# Patient Record
Sex: Female | Born: 1955 | Race: White | Hispanic: No | Smoking: Former smoker
Health system: Southern US, Community
[De-identification: ages and names within clinical notes are randomized; demographics above are authoritative.]

## PROBLEM LIST (undated history)

## (undated) DIAGNOSIS — Z87891 Personal history of nicotine dependence: Secondary | ICD-10-CM

## (undated) DIAGNOSIS — L509 Urticaria, unspecified: Secondary | ICD-10-CM

## (undated) DIAGNOSIS — I10 Essential (primary) hypertension: Secondary | ICD-10-CM

## (undated) HISTORY — DX: Urticaria, unspecified: L50.9

## (undated) HISTORY — PX: TOTAL HIP ARTHROPLASTY: SHX124

## (undated) HISTORY — DX: Essential (primary) hypertension: I10

## (undated) HISTORY — DX: Personal history of nicotine dependence: Z87.891

## (undated) HISTORY — PX: CHOLECYSTECTOMY: SHX55

## (undated) HISTORY — PX: VESICOVAGINAL FISTULA CLOSURE W/ TAH: SUR271

---

## 2010-11-03 DIAGNOSIS — F419 Anxiety disorder, unspecified: Secondary | ICD-10-CM | POA: Insufficient documentation

## 2013-03-27 ENCOUNTER — Ambulatory Visit (INDEPENDENT_AMBULATORY_CARE_PROVIDER_SITE_OTHER): Payer: No Typology Code available for payment source | Admitting: Critical Care Medicine

## 2013-03-27 ENCOUNTER — Encounter: Payer: Self-pay | Admitting: Critical Care Medicine

## 2013-03-27 VITALS — BP 142/90 | HR 88 | Temp 98.2°F | Ht 64.0 in | Wt 197.0 lb

## 2013-03-27 DIAGNOSIS — R05 Cough: Secondary | ICD-10-CM | POA: Insufficient documentation

## 2013-03-27 DIAGNOSIS — F17200 Nicotine dependence, unspecified, uncomplicated: Secondary | ICD-10-CM | POA: Insufficient documentation

## 2013-03-27 DIAGNOSIS — I10 Essential (primary) hypertension: Secondary | ICD-10-CM | POA: Insufficient documentation

## 2013-03-27 DIAGNOSIS — J8489 Other specified interstitial pulmonary diseases: Secondary | ICD-10-CM | POA: Insufficient documentation

## 2013-03-27 DIAGNOSIS — J189 Pneumonia, unspecified organism: Secondary | ICD-10-CM

## 2013-03-27 DIAGNOSIS — R918 Other nonspecific abnormal finding of lung field: Secondary | ICD-10-CM

## 2013-03-27 DIAGNOSIS — R059 Cough, unspecified: Secondary | ICD-10-CM | POA: Insufficient documentation

## 2013-03-27 NOTE — Patient Instructions (Signed)
We need to consider a bronchoscopy to evaluate your lungs Stop smoking , use nicorette minis 4mg  or nicoderm CQ patches 21mg  daily (step one) Stay on spiriva daily We will obtain a copy of your CT Scan and your hospital records Return 1 month

## 2013-03-27 NOTE — Progress Notes (Signed)
Subjective:    Patient ID: Amber Scott, female    DOB: 04-29-1955, 58 y.o.   MRN: [id number]  HPI Hx of recurrent PNA, twice. ? Obstruction in lung and hard to improve  First started PNA 01/29/2013. Stayed until 01/31/13. Readmitted 02/18/13 until 02/22/13.  Both times on ABX and STeroids. Also started spiriva. Pt with persisting low grade fever.  CT Scan done abn and now referred.     Now: min cough, and dry, was productive. Was bloody and cleared out, now mucus is white.  Temp now 99, was 104 when ill.  Notes some chest pain, spine hurts up into neck area. No dysphagia.  Occ hoarseness. No heartburn.  occ sinus issues and postnasal drip and has allergies esp in eyes. No ABX in two weeks  Pt has been on steroids and now is off. No wheezing except in the early AM Pt smokes, trying to cut back.  Now smokes 1PK every 5 days, was 1PPD.      Past Medical History  Diagnosis Date  . Hypertension      Family History  Problem Relation Age of Onset  . Emphysema Maternal Uncle     unlce  . Lung cancer Mother   . Lung cancer Maternal Uncle      History   Social History  . Marital Status: Divorced    Spouse Name: N/A    Number of Children: N/A  . Years of Education: N/A   Occupational History  . Retired    Social History Main Topics  . Smoking status: Current Every Day Smoker -- 0.25 packs/day    Types: Cigarettes    Start date: 01/11/1973  . Smokeless tobacco: Never Used  . Alcohol Use: Yes     Comment: wine nightly   . Drug Use: No  . Sexual Activity: Not on file   Other Topics Concern  . Not on file   Social History Narrative  . No narrative on file     No Known Allergies   No outpatient prescriptions prior to visit.   No facility-administered medications prior to visit.     Review of Systems  Constitutional: Positive for fever, chills, diaphoresis, activity change, appetite change and fatigue. Negative for unexpected weight change.  HENT: Positive for ear pain,  postnasal drip, rhinorrhea, sneezing, sore throat, trouble swallowing and voice change. Negative for congestion, dental problem, ear discharge, facial swelling, hearing loss, mouth sores, nosebleeds, sinus pressure and tinnitus.   Eyes: Positive for itching. Negative for photophobia, discharge and visual disturbance.  Respiratory: Positive for cough, chest tightness, shortness of breath and wheezing. Negative for apnea, choking and stridor.   Cardiovascular: Positive for chest pain and palpitations. Negative for leg swelling.  Gastrointestinal: Positive for abdominal pain and abdominal distention. Negative for nausea, vomiting, constipation and blood in stool.  Genitourinary: Positive for flank pain. Negative for dysuria, urgency, frequency, hematuria, decreased urine volume and difficulty urinating.  Musculoskeletal: Positive for back pain, gait problem and neck pain. Negative for arthralgias, joint swelling, myalgias and neck stiffness.  Skin: Positive for rash. Negative for color change and pallor.  Neurological: Positive for dizziness, weakness, light-headedness, numbness and headaches. Negative for tremors, seizures, syncope and speech difficulty.  Hematological: Negative for adenopathy. Does not bruise/bleed easily.  Psychiatric/Behavioral: Positive for confusion, sleep disturbance and agitation. The patient is nervous/anxious.        Objective:   Physical Exam Filed Vitals:   03/27/13 1147  BP: 142/90  Pulse: 88  Temp:  98.2 F (36.8 C)  TempSrc: Oral  Height: 5\' 4"  (1.626 m)  Weight: 89.359 kg (197 lb)  SpO2: 94%    Gen: Pleasant, well-nourished, in no distress,  normal affect  ENT: No lesions,  mouth clear,  oropharynx clear, no postnasal drip  Neck: No JVD, no TMG, no carotid bruits  Lungs: No use of accessory muscles, no dullness to percussion, distant BS  Cardiovascular: RRR, heart sounds normal, no murmur or gallops, no peripheral edema  Abdomen: soft and NT, no HSM,   BS normal  Musculoskeletal: No deformities, no cyanosis or clubbing  Neuro: alert, non focal  Skin: Warm, no lesions or rashes  No results found.  No CT available for review [facility] and [facility] into [facility]      Assessment & Plan:   Abnormal CT scan of lung LUL nodule and recurrent pneumonia Plan Needs FOB Obtain outside CT scan chest for review first  Tobacco use disorder Ongoing tobacco use Stop smoking , use nicorette minis 4mg  or nicoderm CQ patches 21mg  daily (step one) >8910min smoking cessation counseling issued  Cough Recurrent cough and pneumonia Needs FOB  Pneumonia Recurrent LUL PNA Needs FOB   Updated Medication List Outpatient Encounter Prescriptions as of 03/27/2013  Medication Sig  . acetaminophen (TYLENOL) 500 MG tablet Take 500 mg by mouth every 6 (six) hours as needed.  Marland Kitchen. amLODipine (NORVASC) 5 MG tablet Take 5 mg by mouth daily.  Marland Kitchen. aspirin 81 MG tablet Take 81 mg by mouth daily.  . citalopram (CELEXA) 20 MG tablet Take 20 mg by mouth daily.  . diphenhydrAMINE (BENADRYL) 2 % cream Apply topically as needed for itching.  . diphenhydrAMINE (BENADRYL) 25 MG tablet Take by mouth. 1 in the morning and 2 at night  . Docusate Calcium (STOOL SOFTENER PO) Take 2 capsules by mouth daily.  Marland Kitchen. ENSURE (ENSURE) Take 237 mLs by mouth 2 (two) times daily.  . Lactobacillus (ACIDOPHILUS PO) Take 1 capsule by mouth daily.  Marland Kitchen. omeprazole (PRILOSEC) 20 MG capsule Take 20 mg by mouth 2 (two) times daily.  Marland Kitchen. tiotropium (SPIRIVA) 18 MCG inhalation capsule Place 18 mcg into inhaler and inhale daily.

## 2013-03-28 NOTE — Assessment & Plan Note (Signed)
Recurrent cough and pneumonia Needs FOB

## 2013-03-28 NOTE — Assessment & Plan Note (Signed)
LUL nodule and recurrent pneumonia Plan Needs FOB Obtain outside CT scan chest for review first

## 2013-03-28 NOTE — Assessment & Plan Note (Signed)
Recurrent LUL PNA Needs FOB

## 2013-03-28 NOTE — Assessment & Plan Note (Signed)
Ongoing tobacco use Stop smoking , use nicorette minis 4mg  or nicoderm CQ patches 21mg  daily (step one) >2010min smoking cessation counseling issued

## 2013-04-02 ENCOUNTER — Institutional Professional Consult (permissible substitution): Payer: Self-pay | Admitting: Internal Medicine

## 2013-04-04 ENCOUNTER — Telehealth: Payer: Self-pay | Admitting: Critical Care Medicine

## 2013-04-04 NOTE — Telephone Encounter (Signed)
Tell her i will call her Thursday sometime to arrange a bronchoscopy for next week.  I have seen the CT scan

## 2013-04-04 NOTE — Telephone Encounter (Signed)
Pt's instructions from 03/27/13 OV:  Patient Instructions     We need to consider a bronchoscopy to evaluate your lungs  Stop smoking , use nicorette minis 4mg  or nicoderm CQ patches 21mg  daily (step one)  Stay on spiriva daily  We will obtain a copy of your CT Scan and your hospital records  Return 1 month   -------  Dr. Delford FieldWright, pt was called by schedulers to reschedule her April follow up in [place] d/t new schedule changes.  I called her back.  Pt states she is still not feeling well and can't get her energy back.  Has a low grade fever 99-99.4.  She takes tylenol for this.  Has some increased DOE and a little chest pain.   No cough.  Pt reports she is still waiting to hear if she will need to have bronch or not.  The CT disc is on your desk.  Pt has currently been scheduled for a follow up in May in [place].  Pls advise.  Thank you.

## 2013-04-05 ENCOUNTER — Other Ambulatory Visit: Payer: Self-pay | Admitting: Critical Care Medicine

## 2013-04-05 ENCOUNTER — Telehealth: Payer: Self-pay | Admitting: Critical Care Medicine

## 2013-04-05 ENCOUNTER — Institutional Professional Consult (permissible substitution): Payer: Self-pay | Admitting: Pulmonary Disease

## 2013-04-05 NOTE — Telephone Encounter (Signed)
[facility], March [facility] Called, spoke with pt.  Informed her of this.  She verbalized understanding, is aware [facility], and to bring a driver.  Pt also aware to be NPO after midnight.  She verbalized understanding and voiced no further questions or concerns at this time.

## 2013-04-05 NOTE — Telephone Encounter (Signed)
Called, spoke with pt.  Informed her below per Dr. Delford FieldWright.  She verbalized understanding.  Will sign off and route msg to PW as a reminder.

## 2013-04-09 ENCOUNTER — Encounter (HOSPITAL_COMMUNITY): Payer: Self-pay

## 2013-04-10 ENCOUNTER — Encounter (HOSPITAL_COMMUNITY)
Admission: RE | Disposition: A | Discharge status: Home / Self Care | Payer: Self-pay | Source: Ambulatory Visit | Attending: Critical Care Medicine

## 2013-04-10 ENCOUNTER — Encounter (HOSPITAL_COMMUNITY): Payer: Self-pay | Admitting: Critical Care Medicine

## 2013-04-10 ENCOUNTER — Ambulatory Visit (HOSPITAL_COMMUNITY): Payer: No Typology Code available for payment source

## 2013-04-10 ENCOUNTER — Ambulatory Visit (HOSPITAL_COMMUNITY)
Admission: RE | Admit: 2013-04-10 | Discharge: 2013-04-10 | Disposition: A | Discharge status: Home / Self Care | Payer: No Typology Code available for payment source | Source: Ambulatory Visit | Attending: Critical Care Medicine | Admitting: Critical Care Medicine

## 2013-04-10 DIAGNOSIS — R918 Other nonspecific abnormal finding of lung field: Secondary | ICD-10-CM

## 2013-04-10 DIAGNOSIS — R05 Cough: Secondary | ICD-10-CM

## 2013-04-10 DIAGNOSIS — Z8701 Personal history of pneumonia (recurrent): Secondary | ICD-10-CM | POA: Insufficient documentation

## 2013-04-10 DIAGNOSIS — Z7982 Long term (current) use of aspirin: Secondary | ICD-10-CM | POA: Insufficient documentation

## 2013-04-10 DIAGNOSIS — R079 Chest pain, unspecified: Secondary | ICD-10-CM | POA: Insufficient documentation

## 2013-04-10 DIAGNOSIS — Z79899 Other long term (current) drug therapy: Secondary | ICD-10-CM | POA: Insufficient documentation

## 2013-04-10 DIAGNOSIS — R911 Solitary pulmonary nodule: Secondary | ICD-10-CM | POA: Insufficient documentation

## 2013-04-10 DIAGNOSIS — F17200 Nicotine dependence, unspecified, uncomplicated: Secondary | ICD-10-CM | POA: Insufficient documentation

## 2013-04-10 DIAGNOSIS — J189 Pneumonia, unspecified organism: Secondary | ICD-10-CM

## 2013-04-10 DIAGNOSIS — I10 Essential (primary) hypertension: Secondary | ICD-10-CM | POA: Insufficient documentation

## 2013-04-10 DIAGNOSIS — R059 Cough, unspecified: Secondary | ICD-10-CM

## 2013-04-10 DIAGNOSIS — R509 Fever, unspecified: Secondary | ICD-10-CM | POA: Insufficient documentation

## 2013-04-10 HISTORY — PX: VIDEO BRONCHOSCOPY: SHX5072

## 2013-04-10 SURGERY — BRONCHOSCOPY, WITH FLUOROSCOPY
Anesthesia: Moderate Sedation | Laterality: Bilateral

## 2013-04-10 MED ORDER — FENTANYL CITRATE 0.05 MG/ML IJ SOLN
INTRAMUSCULAR | Status: DC | PRN
  Administered 2013-04-10: 50 ug via INTRAVENOUS

## 2013-04-10 MED ORDER — MIDAZOLAM HCL 10 MG/2ML IJ SOLN
INTRAMUSCULAR | Status: DC | PRN
  Administered 2013-04-10: 1 mg via INTRAVENOUS
  Administered 2013-04-10: 3 mg via INTRAVENOUS

## 2013-04-10 MED ORDER — FENTANYL CITRATE 0.05 MG/ML IJ SOLN
INTRAMUSCULAR | Status: AC
  Filled 2013-04-10: qty 4

## 2013-04-10 MED ORDER — PHENYLEPHRINE HCL 0.25 % NA SOLN: 1.0000 | Freq: Four times a day (QID) | NASAL | Status: DC | PRN

## 2013-04-10 MED ORDER — SODIUM CHLORIDE 0.9 % IV SOLN
INTRAVENOUS | Status: DC
  Administered 2013-04-10: 08:00:00 via INTRAVENOUS

## 2013-04-10 MED ORDER — MIDAZOLAM HCL 10 MG/2ML IJ SOLN
INTRAMUSCULAR | Status: AC
  Filled 2013-04-10: qty 4

## 2013-04-10 MED ORDER — BUTAMBEN-TETRACAINE-BENZOCAINE 2-2-14 % EX AERO: 1.0000 | INHALATION_SPRAY | Freq: Once | CUTANEOUS | Status: DC

## 2013-04-10 MED ORDER — LIDOCAINE HCL 2 % EX GEL
CUTANEOUS | Status: DC | PRN
  Administered 2013-04-10: 2

## 2013-04-10 MED ORDER — LIDOCAINE HCL 2 % EX GEL: Freq: Once | CUTANEOUS | Status: DC

## 2013-04-10 MED ORDER — LIDOCAINE HCL 1 % IJ SOLN
INTRAMUSCULAR | Status: DC | PRN
  Administered 2013-04-10: 2 mL

## 2013-04-10 MED ORDER — PHENYLEPHRINE HCL 0.25 % NA SOLN
NASAL | Status: DC | PRN
  Administered 2013-04-10: 2 via NASAL

## 2013-04-10 NOTE — Progress Notes (Signed)
I agree with the above note Amber LevansPatrick Arnaldo Scott

## 2013-04-10 NOTE — Progress Notes (Signed)
Video Bronchoscopy  Good patient tolerance  Intervention transbronchial biopsy  Intervention bronchial washing

## 2013-04-10 NOTE — Op Note (Signed)
Bronchoscopy Procedure Note  Date of Operation: 04/10/2013  Pre-op Diagnosis: LUL infiltrate  Post-op Diagnosis: same  Surgeon: Shan LevansPatrick Wright  Anesthesia: Monitored Local Anesthesia with Sedation: Versed 3mg IV Fentanyl 50 mcg IV  Operation: Flexible fiberoptic bronchoscopy, diagnostic   Findings: no endobronchial lesions.  Specimen: TBBx LUL lingula, Bronchial washing LUL  Estimated Blood Loss: less than 50   Complications: mild desatuation on [place] at beginning of case, improved with use of NRM 60%  Indications and History: The patient is a 58 y.o. female with LUL infiltrate.  The risks, benefits, complications, treatment options and expected outcomes were discussed with the patient.  The possibilities of reaction to medication, pulmonary aspiration, perforation of a viscus, bleeding, failure to diagnose a condition and creating a complication requiring transfusion or operation were discussed with the patient who freely signed the consent.    Description of Procedure: The patient was re-examined in the bronchoscopy suite and the site of surgery properly noted/marked.  The patient was identified as Amber Scott and the procedure verified as Flexible Fiberoptic Bronchoscopy.  A Time Out was held and the above information confirmed.   After the induction of topical nasopharyngeal anesthesia, the patient was positioned  and the bronchoscope was passed through the R  nares. The vocal cords were visualized and  1% buffered lidocaine 5 ml was topically placed onto the cords. The cords were edematous, GERD changes, mild VCD. The scope was then passed into the trachea.  1% buffered lidocaine 5 ml was used topically on the carina.  Careful inspection of the tracheal lumen was accomplished. The scope was sequentially passed into the left main and then left upper and lower bronchi and segmental bronchi.     TBBX/bronchial washing LUL lingula  was done and there was two specimen.   The scope was  then withdrawn and advanced into the right main bronchus and then into the RUL, RML, and RLL bronchi and segmental bronchi.  No specimens taken on R.  Endobronchial findings: Mild tracheobronchitis Trachea: Normal mucosa Carina: Normal mucosa Right main bronchus: Normal mucosa Right upper lobe bronchus: Normal mucosa and Edematous mucosa Right middle lobe bronchus: Edematous mucosa Right lower lobe bronchus: Edematous mucosa Left main bronchus:  Edematous mucosa Left upper lobe bronchus: Edematous mucosa, no endobronchial lesion Left lower lobe bronchus: Edematous mucosa  The Patient was taken to the Endoscopy Recovery area in satisfactory condition.  Attestation: I performed the procedure.  Luisa HartPatrick WrightMD

## 2013-04-10 NOTE — Interval H&P Note (Signed)
Pt seen and examined and no interval changes in pt exam. Pt ready for FOB and conscious sedation.  Luisa HartPatrick WrightMD

## 2013-04-10 NOTE — H&P (View-Only) (Signed)
Subjective:    Patient ID: Amber Scott, female    DOB: 04-29-1955, 58 y.o.   MRN: [id number]  HPI Hx of recurrent PNA, twice. ? Obstruction in lung and hard to improve  First started PNA 01/29/2013. Stayed until 01/31/13. Readmitted 02/18/13 until 02/22/13.  Both times on ABX and STeroids. Also started spiriva. Pt with persisting low grade fever.  CT Scan done abn and now referred.     Now: min cough, and dry, was productive. Was bloody and cleared out, now mucus is white.  Temp now 99, was 104 when ill.  Notes some chest pain, spine hurts up into neck area. No dysphagia.  Occ hoarseness. No heartburn.  occ sinus issues and postnasal drip and has allergies esp in eyes. No ABX in two weeks  Pt has been on steroids and now is off. No wheezing except in the early AM Pt smokes, trying to cut back.  Now smokes 1PK every 5 days, was 1PPD.      Past Medical History  Diagnosis Date  . Hypertension      Family History  Problem Relation Age of Onset  . Emphysema Maternal Uncle     unlce  . Lung cancer Mother   . Lung cancer Maternal Uncle      History   Social History  . Marital Status: Divorced    Spouse Name: N/A    Number of Children: N/A  . Years of Education: N/A   Occupational History  . Retired    Social History Main Topics  . Smoking status: Current Every Day Smoker -- 0.25 packs/day    Types: Cigarettes    Start date: 01/11/1973  . Smokeless tobacco: Never Used  . Alcohol Use: Yes     Comment: wine nightly   . Drug Use: No  . Sexual Activity: Not on file   Other Topics Concern  . Not on file   Social History Narrative  . No narrative on file     No Known Allergies   No outpatient prescriptions prior to visit.   No facility-administered medications prior to visit.     Review of Systems  Constitutional: Positive for fever, chills, diaphoresis, activity change, appetite change and fatigue. Negative for unexpected weight change.  HENT: Positive for ear pain,  postnasal drip, rhinorrhea, sneezing, sore throat, trouble swallowing and voice change. Negative for congestion, dental problem, ear discharge, facial swelling, hearing loss, mouth sores, nosebleeds, sinus pressure and tinnitus.   Eyes: Positive for itching. Negative for photophobia, discharge and visual disturbance.  Respiratory: Positive for cough, chest tightness, shortness of breath and wheezing. Negative for apnea, choking and stridor.   Cardiovascular: Positive for chest pain and palpitations. Negative for leg swelling.  Gastrointestinal: Positive for abdominal pain and abdominal distention. Negative for nausea, vomiting, constipation and blood in stool.  Genitourinary: Positive for flank pain. Negative for dysuria, urgency, frequency, hematuria, decreased urine volume and difficulty urinating.  Musculoskeletal: Positive for back pain, gait problem and neck pain. Negative for arthralgias, joint swelling, myalgias and neck stiffness.  Skin: Positive for rash. Negative for color change and pallor.  Neurological: Positive for dizziness, weakness, light-headedness, numbness and headaches. Negative for tremors, seizures, syncope and speech difficulty.  Hematological: Negative for adenopathy. Does not bruise/bleed easily.  Psychiatric/Behavioral: Positive for confusion, sleep disturbance and agitation. The patient is nervous/anxious.        Objective:   Physical Exam Filed Vitals:   03/27/13 1147  BP: 142/90  Pulse: 88  Temp:  98.2 F (36.8 C)  TempSrc: Oral  Height: 5\' 4"  (1.626 m)  Weight: 89.359 kg (197 lb)  SpO2: 94%    Gen: Pleasant, well-nourished, in no distress,  normal affect  ENT: No lesions,  mouth clear,  oropharynx clear, no postnasal drip  Neck: No JVD, no TMG, no carotid bruits  Lungs: No use of accessory muscles, no dullness to percussion, distant BS  Cardiovascular: RRR, heart sounds normal, no murmur or gallops, no peripheral edema  Abdomen: soft and NT, no HSM,   BS normal  Musculoskeletal: No deformities, no cyanosis or clubbing  Neuro: alert, non focal  Skin: Warm, no lesions or rashes  No results found.  No CT available for review [facility] and [facility] into epic      Assessment & Plan:   Abnormal CT scan of lung LUL nodule and recurrent pneumonia Plan Needs FOB Obtain outside CT scan chest for review first  Tobacco use disorder Ongoing tobacco use Stop smoking , use nicorette minis 4mg  or nicoderm CQ patches 21mg  daily (step one) >8910min smoking cessation counseling issued  Cough Recurrent cough and pneumonia Needs FOB  Pneumonia Recurrent LUL PNA Needs FOB   Updated Medication List Outpatient Encounter Prescriptions as of 03/27/2013  Medication Sig  . acetaminophen (TYLENOL) 500 MG tablet Take 500 mg by mouth every 6 (six) hours as needed.  Marland Kitchen. amLODipine (NORVASC) 5 MG tablet Take 5 mg by mouth daily.  Marland Kitchen. aspirin 81 MG tablet Take 81 mg by mouth daily.  . citalopram (CELEXA) 20 MG tablet Take 20 mg by mouth daily.  . diphenhydrAMINE (BENADRYL) 2 % cream Apply topically as needed for itching.  . diphenhydrAMINE (BENADRYL) 25 MG tablet Take by mouth. 1 in the morning and 2 at night  . Docusate Calcium (STOOL SOFTENER PO) Take 2 capsules by mouth daily.  Marland Kitchen. ENSURE (ENSURE) Take 237 mLs by mouth 2 (two) times daily.  . Lactobacillus (ACIDOPHILUS PO) Take 1 capsule by mouth daily.  Marland Kitchen. omeprazole (PRILOSEC) 20 MG capsule Take 20 mg by mouth 2 (two) times daily.  Marland Kitchen. tiotropium (SPIRIVA) 18 MCG inhalation capsule Place 18 mcg into inhaler and inhale daily.

## 2013-04-10 NOTE — Discharge Instructions (Signed)
Flexible Bronchoscopy, Care After These instructions give you information on caring for yourself after your procedure. Your doctor may also give you more specific instructions. Call your doctor if you have any problems or questions after your procedure. HOME CARE  Do not eat or drink anything for 2 hours after your procedure. If you try to eat or drink before the medicine wears off, food or drink could go into your lungs. You could also burn yourself.  After 2 hours have passed and when you can cough and gag normally, you may eat soft food and drink liquids slowly.  The day after the test, you may eat your normal diet.  You may do your normal activities.  Keep all doctor visits. GET HELP RIGHT AWAY IF:  You get more and more short of breath.  You get lightheaded.  You feel like you are going to pass out (faint).  You have chest pain.  You have new problems that worry you.  You cough up more than a little blood.  You cough up more blood than before. MAKE SURE YOU:  Understand these instructions.  Will watch your condition.  Will get help right away if you are not doing well or get worse. Document Released: 10/25/2008 Document Revised: 10/18/2012 Document Reviewed: 09/01/2012 [facility] Patient Information 2014 [employer] [place].   DO NOT EAT OR DRINK ANYTHING UNTIL 10am TODAY AT 04/10/13

## 2013-04-11 ENCOUNTER — Encounter (HOSPITAL_COMMUNITY): Payer: Self-pay | Admitting: Critical Care Medicine

## 2013-04-12 ENCOUNTER — Telehealth: Payer: Self-pay | Admitting: Critical Care Medicine

## 2013-04-12 NOTE — Telephone Encounter (Signed)
Pt is aware that her results are still pending per the order. Advised her that we will call her once the results are in. She agreed and verbalized understanding.

## 2013-04-13 LAB — CULTURE, RESPIRATORY: SPECIAL REQUESTS: NORMAL

## 2013-04-13 LAB — CULTURE, RESPIRATORY W GRAM STAIN

## 2013-04-16 ENCOUNTER — Other Ambulatory Visit: Payer: Self-pay | Admitting: Critical Care Medicine

## 2013-04-16 ENCOUNTER — Other Ambulatory Visit: Payer: Self-pay | Admitting: *Deleted

## 2013-04-16 MED ORDER — AMOXICILLIN-POT CLAVULANATE 875-125 MG PO TABS: 1.0000 | ORAL_TABLET | Freq: Two times a day (BID) | ORAL | Status: DC

## 2013-04-24 ENCOUNTER — Ambulatory Visit: Payer: No Typology Code available for payment source | Admitting: Critical Care Medicine

## 2013-05-07 LAB — FUNGUS CULTURE W SMEAR
FUNGAL SMEAR: NONE SEEN
SPECIAL REQUESTS: NORMAL

## 2013-05-10 ENCOUNTER — Encounter: Payer: Self-pay | Admitting: Critical Care Medicine

## 2013-05-10 ENCOUNTER — Ambulatory Visit (INDEPENDENT_AMBULATORY_CARE_PROVIDER_SITE_OTHER): Payer: No Typology Code available for payment source | Admitting: Critical Care Medicine

## 2013-05-10 VITALS — BP 136/80 | HR 69 | Temp 97.8°F | Ht 64.0 in | Wt 201.0 lb

## 2013-05-10 DIAGNOSIS — F17200 Nicotine dependence, unspecified, uncomplicated: Secondary | ICD-10-CM

## 2013-05-10 DIAGNOSIS — Z9181 History of falling: Secondary | ICD-10-CM

## 2013-05-10 DIAGNOSIS — J8409 Other alveolar and parieto-alveolar conditions: Secondary | ICD-10-CM

## 2013-05-10 DIAGNOSIS — J8489 Other specified interstitial pulmonary diseases: Secondary | ICD-10-CM

## 2013-05-10 DIAGNOSIS — R5381 Other malaise: Secondary | ICD-10-CM

## 2013-05-10 DIAGNOSIS — Z23 Encounter for immunization: Secondary | ICD-10-CM

## 2013-05-10 MED ORDER — FLUTICASONE PROPIONATE 50 MCG/ACT NA SUSP: 2.0000 | Freq: Every day | NASAL | Status: DC

## 2013-05-10 MED ORDER — TIOTROPIUM BROMIDE MONOHYDRATE 18 MCG IN CAPS: 18.0000 ug | ORAL_CAPSULE | Freq: Every day | RESPIRATORY_TRACT | Status: DC

## 2013-05-10 NOTE — Patient Instructions (Addendum)
A physical therapy evaluation for falls will be made Start flonase two puff daily ea nostril No other medication changes Work on smoking cessation, use nicotine lozenges A pneumovax was given Return 3 months

## 2013-05-10 NOTE — Progress Notes (Signed)
Subjective:    Patient ID: Amber Scott, female    DOB: 01/19/1955, 58 y.o.   MRN: [id number]  HPI  Hx of recurrent PNA, twice. ? Obstruction in lung and hard to improve  First started PNA 01/29/2013. Stayed until 01/31/13. Readmitted 02/18/13 until 02/22/13.  Both times on ABX and STeroids. Also started spiriva. Pt with persisting low grade fever.  CT Scan done abn and now referred.     Now: min cough, and dry, was productive. Was bloody and cleared out, now mucus is white.  Temp now 99, was 104 when ill.  Notes some chest pain, spine hurts up into neck area. No dysphagia.  Occ hoarseness. No heartburn.  occ sinus issues and postnasal drip and has allergies esp in eyes. No ABX in two weeks  Pt has been on steroids and now is off. No wheezing except in the early AM Pt smokes, trying to cut back.  Now smokes 1PK every 5 days, was 1PPD.     05/10/2013 Chief Complaint  Patient presents with  . Follow-up    Pt c/o fatigue, sinus congestion, PND, nonprod cough in a.m.  Believes the cough comes from snoring at night.  Pt interested in prevnar.   Pt had FOB, was neg exc beta hemolytic strep on c/s Pt down to 10 cig every two days.  Using ecigs vapor Notes some sinus congestion.  Notes some phlegm in the AM.   ?prevnar use     Review of Systems  Constitutional: Positive for fever, chills, diaphoresis, activity change, appetite change and fatigue. Negative for unexpected weight change.  HENT: Positive for ear pain, postnasal drip, rhinorrhea, sneezing, sore throat, trouble swallowing and voice change. Negative for congestion, dental problem, ear discharge, facial swelling, hearing loss, mouth sores, nosebleeds, sinus pressure and tinnitus.   Eyes: Positive for itching. Negative for photophobia, discharge and visual disturbance.  Respiratory: Positive for cough, chest tightness, shortness of breath and wheezing. Negative for apnea, choking and stridor.   Cardiovascular: Positive for chest pain  and palpitations. Negative for leg swelling.  Gastrointestinal: Positive for abdominal pain and abdominal distention. Negative for nausea, vomiting, constipation and blood in stool.  Genitourinary: Positive for flank pain. Negative for dysuria, urgency, frequency, hematuria, decreased urine volume and difficulty urinating.  Musculoskeletal: Positive for back pain, gait problem and neck pain. Negative for arthralgias, joint swelling, myalgias and neck stiffness.  Skin: Positive for rash. Negative for color change and pallor.  Neurological: Positive for dizziness, weakness, light-headedness, numbness and headaches. Negative for tremors, seizures, syncope and speech difficulty.  Hematological: Negative for adenopathy. Does not bruise/bleed easily.  Psychiatric/Behavioral: Positive for confusion, sleep disturbance and agitation. The patient is nervous/anxious.        Objective:   Physical Exam  Filed Vitals:   05/10/13 1059  BP: 136/80  Pulse: 69  Temp: 97.8 F (36.6 C)  TempSrc: Oral  Height: 5\' 4"  (1.626 m)  Weight: 201 lb (91.173 kg)  SpO2: 97%    Gen: Pleasant, well-nourished, in no distress,  normal affect  ENT: No lesions,  mouth clear,  oropharynx clear, no postnasal drip  Neck: No JVD, no TMG, no carotid bruits  Lungs: No use of accessory muscles, no dullness to percussion, distant BS  Cardiovascular: RRR, heart sounds normal, no murmur or gallops, no peripheral edema  Abdomen: soft and NT, no HSM,  BS normal  Musculoskeletal: No deformities, no cyanosis or clubbing  Neuro: alert, non focal  Skin: Warm, no lesions  or rashes  No results found.       Assessment & Plan:   Organized pneumonia Left upper lobe organized pneumonia now resolved Recent bronchoscopy was negative for malignancy Streptococcus beta-hemolytic was positive on culture Plan Start flonase two puff daily ea nostril No other medication changes Work on smoking cessation, use nicotine  lozenges A pneumovax was given Return 3 months   Tobacco use disorder Ongoing tobacco use Smoking cessation counseling was given   At high risk for falls Physical therapy consultation will be obtained  Updated Medication List Outpatient Encounter Prescriptions as of 05/10/2013  Medication Sig  . acetaminophen (TYLENOL) 500 MG tablet Take 500 mg by mouth every 6 (six) hours as needed.  Marland Kitchen. amLODipine (NORVASC) 5 MG tablet Take 5 mg by mouth daily.  Marland Kitchen. aspirin 81 MG tablet Take 81 mg by mouth daily.  . citalopram (CELEXA) 20 MG tablet Take 20 mg by mouth daily.  . diphenhydrAMINE (BENADRYL) 2 % cream Apply topically as needed for itching.  . diphenhydrAMINE (BENADRYL) 25 MG tablet Take by mouth. 1 in the morning and 2 at night  . Docusate Calcium (STOOL SOFTENER PO) Take 2 capsules by mouth daily.  Marland Kitchen. ENSURE (ENSURE) Take 237 mLs by mouth 2 (two) times daily.  . Lactobacillus (ACIDOPHILUS PO) Take 1 capsule by mouth daily. probiotic  . omeprazole (PRILOSEC) 20 MG capsule Take 20 mg by mouth 2 (two) times daily.  Marland Kitchen. tiotropium (SPIRIVA) 18 MCG inhalation capsule Place 1 capsule (18 mcg total) into inhaler and inhale daily.  . [DISCONTINUED] tiotropium (SPIRIVA) 18 MCG inhalation capsule Place 18 mcg into inhaler and inhale daily.  . fluticasone (FLONASE) 50 MCG/ACT nasal spray Place 2 sprays into both nostrils daily.  . [DISCONTINUED] amoxicillin-clavulanate (AUGMENTIN) 875-125 MG per tablet Take 1 tablet by mouth 2 (two) times daily.

## 2013-05-11 DIAGNOSIS — Z9181 History of falling: Secondary | ICD-10-CM | POA: Insufficient documentation

## 2013-05-11 NOTE — Assessment & Plan Note (Signed)
Ongoing tobacco use Smoking cessation counseling was given

## 2013-05-11 NOTE — Assessment & Plan Note (Signed)
Left upper lobe organized pneumonia now resolved Recent bronchoscopy was negative for malignancy Streptococcus beta-hemolytic was positive on culture Plan Start flonase two puff daily ea nostril No other medication changes Work on smoking cessation, use nicotine lozenges A pneumovax was given Return 3 months

## 2013-05-16 ENCOUNTER — Telehealth: Payer: Self-pay | Admitting: Critical Care Medicine

## 2013-05-16 DIAGNOSIS — R5381 Other malaise: Secondary | ICD-10-CM

## 2013-05-16 DIAGNOSIS — Z9181 History of falling: Secondary | ICD-10-CM

## 2013-05-16 NOTE — Telephone Encounter (Signed)
During pt's OV with PW on 05/10/13, we ordered PT to eval and tx for frequent falls. Pt has orange card.  [facility]. Spoke with PW.  OK to place order for [facility] referral.   Order has been placed.  Pt is aware.

## 2013-05-17 ENCOUNTER — Ambulatory Visit: Payer: No Typology Code available for payment source | Admitting: Critical Care Medicine

## 2013-05-23 LAB — AFB CULTURE WITH SMEAR (NOT AT ARMC)
ACID FAST SMEAR: NONE SEEN
Special Requests: NORMAL

## 2013-08-13 ENCOUNTER — Telehealth: Payer: Self-pay | Admitting: Critical Care Medicine

## 2013-08-13 NOTE — Telephone Encounter (Signed)
Spoke with patient-- wants to know if she can have the Prevnar-13 vaccine at her 49mo follow-up Had 1st PNA vaccine 04/2013, was told to follow up x 3 months [place] 08/20/13 at 9:15a Nothing further needed.

## 2013-08-20 ENCOUNTER — Encounter: Payer: Self-pay | Admitting: Critical Care Medicine

## 2013-08-20 ENCOUNTER — Ambulatory Visit (INDEPENDENT_AMBULATORY_CARE_PROVIDER_SITE_OTHER): Payer: No Typology Code available for payment source | Admitting: Critical Care Medicine

## 2013-08-20 VITALS — BP 144/86 | HR 61 | Temp 97.0°F | Ht 64.5 in | Wt 206.4 lb

## 2013-08-20 DIAGNOSIS — Z23 Encounter for immunization: Secondary | ICD-10-CM

## 2013-08-20 DIAGNOSIS — J449 Chronic obstructive pulmonary disease, unspecified: Secondary | ICD-10-CM

## 2013-08-20 DIAGNOSIS — L509 Urticaria, unspecified: Secondary | ICD-10-CM

## 2013-08-20 DIAGNOSIS — F17200 Nicotine dependence, unspecified, uncomplicated: Secondary | ICD-10-CM

## 2013-08-20 DIAGNOSIS — J309 Allergic rhinitis, unspecified: Secondary | ICD-10-CM | POA: Insufficient documentation

## 2013-08-20 DIAGNOSIS — R87628 Other abnormal cytological findings on specimens from vagina: Secondary | ICD-10-CM | POA: Insufficient documentation

## 2013-08-20 DIAGNOSIS — R87629 Unspecified abnormal cytological findings in specimens from vagina: Secondary | ICD-10-CM | POA: Insufficient documentation

## 2013-08-20 DIAGNOSIS — J4489 Other specified chronic obstructive pulmonary disease: Secondary | ICD-10-CM | POA: Insufficient documentation

## 2013-08-20 DIAGNOSIS — K219 Gastro-esophageal reflux disease without esophagitis: Secondary | ICD-10-CM | POA: Insufficient documentation

## 2013-08-20 DIAGNOSIS — M5412 Radiculopathy, cervical region: Secondary | ICD-10-CM | POA: Insufficient documentation

## 2013-08-20 DIAGNOSIS — M542 Cervicalgia: Secondary | ICD-10-CM | POA: Insufficient documentation

## 2013-08-20 NOTE — Assessment & Plan Note (Signed)
Recurrent hives of unclear etiology with lack of response to Benadryl antihistamine Plan Referral to allergy will be made

## 2013-08-20 NOTE — Progress Notes (Addendum)
Subjective:    Patient ID: Amber GivensCynthia C Scott, female    DOB: 09/11/1955, 58 y.o.   MRN: [id number]  HPI 08/20/2013 Chief Complaint  Patient presents with  . Follow-up    Occas sharp to dull pain in left chest radiating to back and difficulty swallowing.  No SOB, wheezing, or cough.   Pt still with hives.  Pt still weak.  No cough , no wheezing , no dyspnea. Still with dysphagia. Gets hot spots on skin.   Pt denies any significant sore throat, nasal congestion or excess secretions, fever, chills, sweats, unintended weight loss, pleurtic or exertional chest pain, orthopnea PND, or leg swelling Pt denies any increase in rescue therapy over baseline, denies waking up needing it or having any early am or nocturnal exacerbations of coughing/wheezing/or dyspnea. Pt also denies any obvious fluctuation in symptoms with  weather or environmental change or other alleviating or aggravating factors   Review of Systems Constitutional:   No  weight loss, night sweats,  Fevers, chills, fatigue, lassitude. HEENT:   No headaches,  Difficulty swallowing,  Tooth/dental problems,  Sore throat,                No sneezing, itching, ear ache, nasal congestion, post nasal drip,   CV:  No chest pain,  Orthopnea, PND, swelling in lower extremities, anasarca, dizziness, palpitations  GI  No heartburn, indigestion, abdominal pain, nausea, vomiting, diarrhea, change in bowel habits, loss of appetite  Resp: No shortness of breath with exertion or at rest.  No excess mucus, no productive cough,  No non-productive cough,  No coughing up of blood.  No change in color of mucus.  No wheezing.  No chest wall deformity  Skin: Significant recurrent rash  GU: no dysuria, change in color of urine, no urgency or frequency.  No flank pain.  MS:  No joint pain or swelling.  No decreased range of motion.  No back pain.  Psych:  No change in mood or affect. No depression or anxiety.  No memory loss.     Objective:   Physical Exam Filed Vitals:   08/20/13 0911  BP: 144/86  Pulse: 61  Temp: 97 F (36.1 C)  TempSrc: Oral  Height: 5' 4.5" (1.638 m)  Weight: 206 lb 6.4 oz (93.622 kg)  SpO2: 93%    Gen: Pleasant, well-nourished, in no distress,  normal affect  ENT: No lesions,  mouth clear,  oropharynx clear, no postnasal drip  Neck: No JVD, no TMG, no carotid bruits  Lungs: No use of accessory muscles, no dullness to percussion, distant breath sounds  Cardiovascular: RRR, heart sounds normal, no murmur or gallops, no peripheral edema  Abdomen: soft and NT, no HSM,  BS normal  Musculoskeletal: No deformities, no cyanosis or clubbing  Neuro: alert, non focal  Skin: Hives present right flank and under left upper arm  No results found.               Assessment & Plan:   Obstructive chronic bronchitis without exacerbation Chronic bronchitis with mild airway obstruction on pulmonary function testing. Ongoing tobacco use. Recent organized pneumonia left upper lobe now resolved. Note negative bronchoscopy for endobronchial obstruction or malignancy Plan Maintain Spiriva daily Focus on further smoking cessation   Hives Recurrent hives of unclear etiology with lack of response to Benadryl antihistamine Plan Referral to allergy will be made   Tobacco use disorder Ongoing tobacco use now down to one pack per week The patient was counseled  for further smoking cessation   Prevnar 13 vaccine was given   Updated Medication List Outpatient Encounter Prescriptions as of 08/20/2013  Medication Sig  . acetaminophen (TYLENOL) 500 MG tablet Take 500 mg by mouth every 6 (six) hours as needed.  Marland Kitchen amLODipine (NORVASC) 5 MG tablet Take 5 mg by mouth daily.  Marland Kitchen aspirin 81 MG tablet Take 81 mg by mouth daily.  . diphenhydrAMINE (BENADRYL) 2 % cream Apply topically as needed for itching.  . diphenhydrAMINE (BENADRYL) 25 MG tablet Take by mouth. 1 in the morning and 2 at night  . Docusate Calcium  (STOOL SOFTENER PO) Take 2 capsules by mouth daily.  Marland Kitchen ENSURE (ENSURE) Take 237 mLs by mouth 2 (two) times daily.  . fluticasone (FLONASE) 50 MCG/ACT nasal spray Place 2 sprays into both nostrils daily as needed.  Marland Kitchen omeprazole (PRILOSEC) 20 MG capsule Take 20 mg by mouth 2 (two) times daily.  Marland Kitchen tiotropium (SPIRIVA) 18 MCG inhalation capsule Place 1 capsule (18 mcg total) into inhaler and inhale daily.  . [DISCONTINUED] fluticasone (FLONASE) 50 MCG/ACT nasal spray Place 2 sprays into both nostrils daily.  . [DISCONTINUED] citalopram (CELEXA) 20 MG tablet Take 20 mg by mouth daily.  . [DISCONTINUED] Lactobacillus (ACIDOPHILUS PO) Take 1 capsule by mouth daily. probiotic

## 2013-08-20 NOTE — Addendum Note (Signed)
Addended by: Nicanor AlconNAGLE, Luvena Wentling K on: 08/20/2013 09:56 AM   Modules accepted: Orders

## 2013-08-20 NOTE — Assessment & Plan Note (Signed)
Ongoing tobacco use now down to one pack per week The patient was counseled for further smoking cessation

## 2013-08-20 NOTE — Patient Instructions (Signed)
A referral to Laurette SchimkeEric Kozlow will be made Stay on spiriva Prevnar was given Focus on smoking cessation Return 4 months

## 2013-08-20 NOTE — Assessment & Plan Note (Signed)
Chronic bronchitis with mild airway obstruction on pulmonary function testing. Ongoing tobacco use. Recent organized pneumonia left upper lobe now resolved. Note negative bronchoscopy for endobronchial obstruction or malignancy Plan Maintain Spiriva daily Focus on further smoking cessation

## 2013-12-27 ENCOUNTER — Encounter: Payer: Self-pay | Admitting: Critical Care Medicine

## 2013-12-27 ENCOUNTER — Ambulatory Visit (INDEPENDENT_AMBULATORY_CARE_PROVIDER_SITE_OTHER): Payer: No Typology Code available for payment source | Admitting: Critical Care Medicine

## 2013-12-27 VITALS — BP 148/90 | HR 71 | Temp 97.7°F | Ht 64.5 in | Wt 206.0 lb

## 2013-12-27 DIAGNOSIS — Z720 Tobacco use: Secondary | ICD-10-CM

## 2013-12-27 DIAGNOSIS — J449 Chronic obstructive pulmonary disease, unspecified: Secondary | ICD-10-CM

## 2013-12-27 DIAGNOSIS — F17200 Nicotine dependence, unspecified, uncomplicated: Secondary | ICD-10-CM

## 2013-12-27 MED ORDER — TIOTROPIUM BROMIDE MONOHYDRATE 18 MCG IN CAPS: 18.0000 ug | ORAL_CAPSULE | Freq: Every day | RESPIRATORY_TRACT | Status: DC

## 2013-12-27 NOTE — Assessment & Plan Note (Signed)
 nicotine cessation counseling issued.

## 2013-12-27 NOTE — Patient Instructions (Signed)
Stay on Spiriva  Stop smoking, use nicotine lozenges 2mg  , can get [facility] brand is less $$ Return 6 months

## 2013-12-27 NOTE — Assessment & Plan Note (Signed)
Chronic obstructive bronchitis stable  Ongoing tobacco use  Plan Stay on Spiriva  Stop smoking, use nicotine lozenges 2mg  ,

## 2013-12-27 NOTE — Progress Notes (Signed)
Subjective:    Patient ID: Amber Scott, female    DOB: 08/28/1955, 58 y.o.   MRN: [id number]  HPI 12/27/2013 Chief Complaint  Patient presents with  . Follow-up    recheck pneumonia; has been taking Spiriva, needs rx written for Spiriva   Since last ov, still smoking 1 Pk every 3-4 days.  Pt walking more.  Now on the spiriva Pt denies any significant sore throat, nasal congestion or excess secretions, fever, chills, sweats, unintended weight loss, pleurtic or exertional chest pain, orthopnea PND, or leg swelling Pt denies any increase in rescue therapy over baseline, denies waking up needing it or having any early am or nocturnal exacerbations of coughing/wheezing/or dyspnea. Pt also denies any obvious fluctuation in symptoms with  weather or environmental change or other alleviating or aggravating factors  Pt saw allergy: hives better.  Review of Systems Constitutional:   No  weight loss, night sweats,  Fevers, chills, fatigue, lassitude. HEENT:   No headaches,  Difficulty swallowing,  Tooth/dental problems,  Sore throat,                No sneezing, itching, ear ache, nasal congestion, post nasal drip,   CV:  No chest pain,  Orthopnea, PND, swelling in lower extremities, anasarca, dizziness, palpitations  GI  No heartburn, indigestion, abdominal pain, nausea, vomiting, diarrhea, change in bowel habits, loss of appetite  Resp: No shortness of breath with exertion or at rest.  No excess mucus, no productive cough,  No non-productive cough,  No coughing up of blood.  No change in color of mucus.  No wheezing.  No chest wall deformity  Skin: Significant recurrent rash  GU: no dysuria, change in color of urine, no urgency or frequency.  No flank pain.  MS:  No joint pain or swelling.  No decreased range of motion.  No back pain.  Psych:  No change in mood or affect. No depression or anxiety.  No memory loss.     Objective:   Physical Exam Filed Vitals:   12/27/13 1006  BP:  148/90  Pulse: 71  Temp: 97.7 F (36.5 C)  TempSrc: Oral  Height: 5' 4.5" (1.638 m)  Weight: 206 lb (93.441 kg)  SpO2: 95%    Gen: Pleasant, well-nourished, in no distress,  normal affect  ENT: No lesions,  mouth clear,  oropharynx clear, no postnasal drip  Neck: No JVD, no TMG, no carotid bruits  Lungs: No use of accessory muscles, no dullness to percussion, distant breath sounds  Cardiovascular: RRR, heart sounds normal, no murmur or gallops, no peripheral edema  Abdomen: soft and NT, no HSM,  BS normal  Musculoskeletal: No deformities, no cyanosis or clubbing  Neuro: alert, non focal  Skin: clear  No results found.               Assessment & Plan:   Obstructive chronic bronchitis without exacerbation Chronic obstructive bronchitis stable  Ongoing tobacco use  Plan Stay on Spiriva  Stop smoking, use nicotine lozenges 2mg  ,   Tobacco use disorder 10min nicotine cessation counseling issued.      Updated Medication List Outpatient Encounter Prescriptions as of 12/27/2013  Medication Sig  . acetaminophen (TYLENOL) 500 MG tablet Take 500 mg by mouth every 6 (six) hours as needed.  Marland Kitchen. amLODipine (NORVASC) 5 MG tablet Take 5 mg by mouth daily.  Marland Kitchen. aspirin 81 MG tablet Take 81 mg by mouth daily.  . diphenhydrAMINE (BENADRYL) 2 % cream Apply topically  as needed for itching.  . diphenhydrAMINE (BENADRYL) 25 MG tablet Take by mouth. 1 in the morning and 2 at night  . Docusate Calcium (STOOL SOFTENER PO) Take 2 capsules by mouth daily.  Marland Kitchen. ENSURE (ENSURE) Take 237 mLs by mouth 2 (two) times daily.  . fexofenadine (ALLEGRA) 180 MG tablet Take 180 mg by mouth daily.  . fluticasone (FLONASE) 50 MCG/ACT nasal spray Place 2 sprays into both nostrils daily as needed.  . montelukast (SINGULAIR) 10 MG tablet Take 10 mg by mouth every evening.  Marland Kitchen. omeprazole (PRILOSEC) 20 MG capsule Take 20 mg by mouth 2 (two) times daily.  . ranitidine (ZANTAC) 150 MG tablet   . tiotropium  (SPIRIVA) 18 MCG inhalation capsule Place 1 capsule (18 mcg total) into inhaler and inhale daily.  . [DISCONTINUED] tiotropium (SPIRIVA) 18 MCG inhalation capsule Place 1 capsule (18 mcg total) into inhaler and inhale daily.

## 2014-03-08 ENCOUNTER — Telehealth: Payer: Self-pay | Admitting: Critical Care Medicine

## 2014-03-08 NOTE — Telephone Encounter (Signed)
lmtcb x1 for [place]. 

## 2014-03-08 NOTE — Telephone Encounter (Signed)
Please refax rx  For 90 day supply cover sheet name of pt address dob fax to [phone] attn beverly

## 2014-03-08 NOTE — Telephone Encounter (Signed)
lmtcb x1 for [place]. We need to know what prescription she is referring to.

## 2014-03-11 NOTE — Telephone Encounter (Signed)
lmtcb for Amber Scott.  

## 2014-03-12 NOTE — Telephone Encounter (Signed)
lmtcb for Amber Scott.  

## 2014-03-12 NOTE — Telephone Encounter (Signed)
Meriam SpragueBeverly returning call stating that she needs nurses full name and title as well as verbal ok to fill med because it came through w/o our name on it ,just had pt name when you return call to her please state  your full name and title and where you are calling from.Caren GriffinsStanley A Dalton

## 2014-03-13 NOTE — Telephone Encounter (Signed)
lmtcb for Amber Scott.  

## 2014-03-14 NOTE — Telephone Encounter (Signed)
LMTC x 1 for beverly

## 2014-03-15 NOTE — Telephone Encounter (Signed)
Received call from [place] with [facility] Needing confirmation of Spiriva rx faxed to them.  Wanting verbal okay to refill this medication and also permission to change from 30 day to 90 day supply.  Permission given for quantity change with 3 refills. Nothing further needed.

## 2014-04-02 ENCOUNTER — Ambulatory Visit (INDEPENDENT_AMBULATORY_CARE_PROVIDER_SITE_OTHER)
Admission: RE | Admit: 2014-04-02 | Discharge: 2014-04-02 | Disposition: A | Discharge status: Home / Self Care | Payer: No Typology Code available for payment source | Source: Ambulatory Visit | Attending: Pulmonary Disease | Admitting: Pulmonary Disease

## 2014-04-02 ENCOUNTER — Encounter: Payer: Self-pay | Admitting: Pulmonary Disease

## 2014-04-02 ENCOUNTER — Ambulatory Visit (INDEPENDENT_AMBULATORY_CARE_PROVIDER_SITE_OTHER): Payer: No Typology Code available for payment source | Admitting: Pulmonary Disease

## 2014-04-02 VITALS — BP 116/70 | HR 82 | Temp 97.9°F | Ht 64.5 in | Wt 209.8 lb

## 2014-04-02 DIAGNOSIS — J209 Acute bronchitis, unspecified: Secondary | ICD-10-CM

## 2014-04-02 DIAGNOSIS — R6883 Chills (without fever): Secondary | ICD-10-CM

## 2014-04-02 LAB — POCT INFLUENZA A/B
Influenza A, POC: NEGATIVE
Influenza B, POC: NEGATIVE

## 2014-04-02 MED ORDER — AZITHROMYCIN 250 MG PO TABS: ORAL_TABLET | ORAL | Status: DC

## 2014-04-02 MED ORDER — HYDROCODONE-HOMATROPINE 5-1.5 MG/5ML PO SYRP: 5.0000 mL | ORAL_SOLUTION | Freq: Four times a day (QID) | ORAL | Status: DC | PRN

## 2014-04-02 NOTE — Patient Instructions (Signed)
Your rapid flu testing is negative Will check chest xray today and call you with results. Start on zpak as directed. Hycodan cough syrup one teaspoon every 6 hrs if needed for cough. Let us know if you are not doing better.

## 2014-04-02 NOTE — Progress Notes (Signed)
   Subjective:    Patient ID: Amber GivensCynthia C Scott, female    DOB: 11/25/1955, 59 y.o.   MRN: [id number]  HPI The patient comes in today for an acute sick visit. Is normally followed by Dr. Delford FieldWright for obstructive chronic bronchitis, probably related to ongoing smoking. She gives a 3 day history of malaise, fever to 101, as well as significant cough with increased shortness of breath during her coughing episodes. She does not have any shortness of breath at rest as long as she is not coughing. She has not produced any mucus, but feels some tightness in her chest.   Review of Systems  Constitutional: Positive for fever and chills. Negative for unexpected weight change.  HENT: Negative for congestion, dental problem, ear pain, nosebleeds, postnasal drip, rhinorrhea, sinus pressure, sneezing, sore throat and trouble swallowing.   Eyes: Negative for redness and itching.  Respiratory: Positive for cough, chest tightness, shortness of breath and wheezing.   Cardiovascular: Negative for palpitations and leg swelling.  Gastrointestinal: Negative for nausea and vomiting.  Genitourinary: Negative for dysuria.  Musculoskeletal: Negative for joint swelling.  Skin: Negative for rash.  Neurological: Negative for headaches.  Hematological: Does not bruise/bleed easily.  Psychiatric/Behavioral: Negative for dysphoric mood. The patient is not nervous/anxious.        Objective:   Physical Exam Obese female in no acute distress Nose without purulence or discharge noted Neck without lymphadenopathy or thyromegaly Chest with mild decrease in breath sounds, adequate airflow, no active wheezing Cardiac exam with regular rate and rhythm Lower extremities with mild ankle edema, no cyanosis Alert and oriented, moves all 4 extremities.       Assessment & Plan:

## 2014-04-02 NOTE — Addendum Note (Signed)
Addended by: Boone MasterJONES, JESSICA E on: 04/02/2014 01:46 PM   Modules accepted: Orders

## 2014-04-02 NOTE — Assessment & Plan Note (Signed)
The patient gives a 3 day history of worsening cough but very little mucus, chest tightness, and severe malaise with a fever to 101. She does not have bronchospasm on exam, and does not have shortness of breath at rest as long as she is not coughing. Her rapid flu test today is negative, and we'll therefore treat her with an antibiotic, although it could be another type of viral process. We'll check a chest x-ray for completeness, and give her medication for cough suppression. We'll hold off on a course of prednisone, but she is to call us if she is not improving.

## 2014-04-03 ENCOUNTER — Telehealth: Payer: Self-pay | Admitting: Pulmonary Disease

## 2014-04-03 NOTE — Telephone Encounter (Signed)
Notes Recorded by Barbaraann ShareKeith M Clance, MD on 04/02/2014 at 4:39 PM Let pt know that her cxr does not show any pneumonia --- Pt is aware of results.

## 2014-09-20 ENCOUNTER — Other Ambulatory Visit: Payer: Self-pay

## 2014-09-20 MED ORDER — OMALIZUMAB 150 MG ~~LOC~~ SOLR
300.0000 mg | SUBCUTANEOUS | Status: DC
  Administered 2014-11-06 – 2017-05-27 (×19): 300 mg via SUBCUTANEOUS

## 2014-10-17 ENCOUNTER — Encounter: Payer: Self-pay | Admitting: Pulmonary Disease

## 2014-10-17 ENCOUNTER — Ambulatory Visit (INDEPENDENT_AMBULATORY_CARE_PROVIDER_SITE_OTHER): Payer: No Typology Code available for payment source | Admitting: Pulmonary Disease

## 2014-10-17 VITALS — BP 106/72 | HR 67 | Ht 64.5 in | Wt 219.8 lb

## 2014-10-17 DIAGNOSIS — Z122 Encounter for screening for malignant neoplasm of respiratory organs: Secondary | ICD-10-CM

## 2014-10-17 DIAGNOSIS — J439 Emphysema, unspecified: Secondary | ICD-10-CM

## 2014-10-17 DIAGNOSIS — Z87891 Personal history of nicotine dependence: Secondary | ICD-10-CM

## 2014-10-17 DIAGNOSIS — Z23 Encounter for immunization: Secondary | ICD-10-CM

## 2014-10-17 NOTE — Progress Notes (Signed)
Subjective:    Patient ID: Amber Scott, female    DOB: 1955-11-23, 59 y.o.   MRN: [id number]  HPI Follow-up for COPD.  Amber Scott is a 59 year old patient with heavy smoking history, COPD. She is a former patient of Dr. Delford Field. She was evaluated last year for recurrent pneumonia. A CT on 03/16/13 showed a lingular opacity. She underwent bronchoscopy for this. There is no evidence of malignancy and the cultures grew hemolytic streptococci. She was treated with antibiotics with improvement in symptoms.  She is maintained on Spiriva and says that her symptoms are reasonably well controlled. She has dyspnea on exertion at baseline. She is complaining of some dry cough after the change in weather during the fall. She denies any sputum production, fevers, chills. She is being evaluated by Dr. Ardyth Harps, allergist for recurrent utricaria. Apparently she has been tested extensively and recently started on Xolair injections. I do not have any records to review at present.  She has smoked 2 packs per day at least for 37 years. She is trying to cut and is down to half pack per day. 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 There are no obvious exposures at work or at home.  Patient Active Problem List   Diagnosis Date Noted  . Acute bronchitis 04/02/2014  . Allergic rhinitis 08/20/2013  . Cervical radiculitis 08/20/2013  . Acid reflux 08/20/2013  . Cervical pain 08/20/2013  . Vaginal vault smear abnormal 08/20/2013  . Obstructive chronic bronchitis without exacerbation (HCC) 08/20/2013  . Hives 08/20/2013  . At high risk for falls 05/11/2013  . Tobacco use disorder 03/27/2013  . Hypertension   . Anxiety 11/03/2010    Current outpatient prescriptions:  .  acetaminophen (TYLENOL) 500 MG tablet, Take 500 mg by mouth every 6 (six) hours as needed., Disp: , Rfl:  .  amLODipine (NORVASC) 10 MG tablet, Take 10 mg by mouth daily., Disp: , Rfl: 3 .  aspirin 81 MG tablet, Take 81 mg by  mouth daily., Disp: , Rfl:  .  cetirizine (ZYRTEC) 10 MG tablet, Take 10 mg by mouth 2 (two) times daily., Disp: , Rfl: 3 .  diphenhydrAMINE (BENADRYL) 2 % cream, Apply topically as needed for itching., Disp: , Rfl:  .  diphenhydrAMINE (BENADRYL) 25 MG tablet, Take by mouth. As needed, Disp: , Rfl:  .  Docusate Calcium (STOOL SOFTENER PO), Take 2 capsules by mouth daily., Disp: , Rfl:  .  ENSURE (ENSURE), Take 237 mLs by mouth 2 (two) times daily., Disp: , Rfl:  .  fluticasone (FLONASE) 50 MCG/ACT nasal spray, Place 2 sprays into both nostrils daily as needed., Disp: , Rfl:  .  montelukast (SINGULAIR) 10 MG tablet, Take 10 mg by mouth every evening., Disp: , Rfl: 5 .  omeprazole (PRILOSEC) 20 MG capsule, Take 20 mg by mouth 2 (two) times daily., Disp: , Rfl:  .  ranitidine (ZANTAC) 150 MG tablet, Take 150 mg by mouth 2 (two) times daily. , Disp: , Rfl: 5 .  sertraline (ZOLOFT) 50 MG tablet, Take 50 mg by mouth daily., Disp: , Rfl: 1 .  tiotropium (SPIRIVA) 18 MCG inhalation capsule, Place 1 capsule (18 mcg total) into inhaler and inhale daily., Disp: 30 capsule, Rfl: 4 .  triamcinolone cream (KENALOG) 0.1 %, Apply 1 application topically 2 (two) times daily., Disp: , Rfl: 0  Current facility-administered medications:  .  omalizumab Geoffry Paradise) injection 300 mg, 300 mg, Subcutaneous, Q28 days, Alvira Philips  Kozlow, MD  Review of Systems  Complains of dry nonproductive cough, dyspnea on exertion No sputum production, wheezing, hemoptysis. No fevers, chills, malaise, fatigue, weight loss. No chest pain, palpitations. No nausea, vomiting, diarrhea. All other review of systems are negative.  Spirometry (03/27/13) FVC 3.69 (115%) FEV1 1.96 (76%) F/F 53  Blood pressure 106/72, pulse 67, height 5' 4.5" (1.638 m), weight 219 lb 12.8 oz (99.701 kg), SpO2 95 %.    Objective:   Physical Exam General: No distress, obese Neuro: No focal deficits HEENT: PERRLA, Moist mucus membranes Cardiovascular:  RRR, S1 S2 Lungs: Clear to auscultation. No wheeze or crackles Abdomen: Soft, BS+ Skin: No rash/bruising.    Assessment & Plan:  #1 Moderate COPD. She is stable and Spiriva. We'll continue the same. Flu shot today.  #2 Smoking. Smoking cessation encouraged. I will also refer her to our lung cancer screening program.  #3 Allergies, utricaria. It is unclear to me what workup was done at the allergist office and while she was started on Xolair. I'll try to obtain records to review.  Chilton Greathouse MD [facility] Pager [phone] If no answer or after 3pm call: [phone] 10/17/2014, 2:29 PM

## 2014-10-17 NOTE — Patient Instructions (Signed)
We will obtain records from Dr. Lucie Leather. Flu shot today Referral to lung cancer screening  Return to clinic in 6 months.

## 2014-10-22 ENCOUNTER — Encounter: Payer: Self-pay | Admitting: Acute Care

## 2014-10-25 ENCOUNTER — Encounter: Payer: Self-pay | Admitting: Acute Care

## 2014-10-25 ENCOUNTER — Other Ambulatory Visit: Payer: Self-pay | Admitting: Acute Care

## 2014-10-25 ENCOUNTER — Telehealth: Payer: Self-pay | Admitting: Acute Care

## 2014-10-25 DIAGNOSIS — F17210 Nicotine dependence, cigarettes, uncomplicated: Principal | ICD-10-CM

## 2014-10-25 NOTE — Telephone Encounter (Signed)
I called and spoke with Ms. Amber Scott about the payment process for the low dose CT with an orange card. I explained that the scan would be billed to her, and that she would then need to go through the process of filing for financial aid [facility]. She understands that [facility] will not pre-certify the scan. She must have the scan, get billed for it, and then apply for the financial aid. I told her I could not guarantee that the entire amount would be paid for. The only factor I knew for sure, was that if she is truly without any insurance, that there would be a 55% discount applied to the bill. Any additional discount would be based on her filing for financial aid through [facility]. She verbalized understanding of this and Understands that she will be billed for the scan and that it is her responsibility to apply for the financial aid and any further discounts are not guaranteed.She verbalized understanding of the above and also the time and location of her scan at [facility]. She has my contact information in the event she has any further questions.

## 2014-11-01 ENCOUNTER — Ambulatory Visit (HOSPITAL_BASED_OUTPATIENT_CLINIC_OR_DEPARTMENT_OTHER): Payer: No Typology Code available for payment source

## 2014-11-01 ENCOUNTER — Ambulatory Visit (INDEPENDENT_AMBULATORY_CARE_PROVIDER_SITE_OTHER): Payer: No Typology Code available for payment source | Admitting: Acute Care

## 2014-11-01 ENCOUNTER — Encounter: Payer: Self-pay | Admitting: Acute Care

## 2014-11-01 DIAGNOSIS — F17210 Nicotine dependence, cigarettes, uncomplicated: Secondary | ICD-10-CM

## 2014-11-01 NOTE — Progress Notes (Signed)
Shared Decision Making Visit Lung Cancer Screening Program [phone]([id number])   Eligibility:  Age 59 y.o.  Pack Years Smoking History Calculation 74 pack years (# packs/per year x # years smoked)  Recent History of coughing up blood  no  Unexplained weight loss? no ( >Than 15 pounds within the last 6 months )  Prior History Lung / other cancer no (Diagnosis within the last 5 years already requiring surveillance chest CT Scans).  Smoking Status Current Smoker  Former Smokers: Years since quit:NA  Quit Date:NA  Visit Components:  Discussion included one or more decision making aids. yes  Discussion included risk/benefits of screening. yes  Discussion included potential follow up diagnostic testing for abnormal scans. yes  Discussion included meaning and risk of over diagnosis. yes  Discussion included meaning and risk of False Positives. yes  Discussion included meaning of total radiation exposure. yes  Counseling Included:  Importance of adherence to annual lung cancer LDCT screening. yes  Impact of comorbidities on ability to participate in the program. yes  Ability and willingness to under diagnostic treatment. yes  Smoking Cessation Counseling:  Current Smokers:   Discussed importance of smoking cessation. yes  Information about tobacco cessation classes and interventions provided to patient. yes  Patient provided with "ticket" for LDCT Scan. yes  Symptomatic Patient. no  Counseling  Diagnosis Code: Tobacco Use Z72.0  Asymptomatic Patient yes  Counseling (Intermediate counseling: > three minutes counseling) [id number]  Former Smokers:   Discussed the importance of maintaining cigarette abstinence.NA  Diagnosis Code: Personal History of Nicotine Dependence. O96.295Z87.891  Information about tobacco cessation classes and interventions provided to patient. NA  Patient provided with "ticket" for LDCT Scan. yes  Written Order for Lung Cancer Screening with LDCT placed  in Epic. Yes (CT Chest Lung Cancer Screening Low Dose W/O CM) [id number] Z12.2-Screening of respiratory organs Z87.891-Personal history of nicotine dependence  I spent 15 minutes of face to face time with Ms. Lorenso CourierMesser discussing the risks and benefits of lung cancer screening. We watched a power point together that addressed the above noted topics, pausing at intervals to allow for questions to be asked and answered.We discussed that the single most powerful action that she can take to decrease her risk of lung cancer is to stop smoking. She is not ready to set a quit date. We discussed nicotine replacement therapy, Chantix, Wellbutrin, and behavior modification as methods that help with smoking cessation. She has tried Wellbutrin in the past without success. She lives alone, and is afraid to try Chantix. She was interested in the nicotine replacement and also in the QUIT SMART classes. I have told her when she is ready to set a quit date to let me know and I will help her in any way I can to make sure she has  the tools to be successful in becoming smoke free. We discussed the time and location of the scan and that I will call her with the results sometime Monday.  She and her daughter verbalized understanding of all of the above and had no further questions upon leaving the office.I have given her a copy of the power point we viewed together in addition to  my card and contact information in the event she has any questions in the future.    Bevelyn NgoSarah F Groce, NP

## 2014-11-06 ENCOUNTER — Ambulatory Visit (HOSPITAL_BASED_OUTPATIENT_CLINIC_OR_DEPARTMENT_OTHER)
Admission: RE | Admit: 2014-11-06 | Discharge: 2014-11-06 | Disposition: A | Discharge status: Home / Self Care | Payer: No Typology Code available for payment source | Source: Ambulatory Visit | Attending: Acute Care | Admitting: Acute Care

## 2014-11-06 ENCOUNTER — Ambulatory Visit (INDEPENDENT_AMBULATORY_CARE_PROVIDER_SITE_OTHER): Payer: Self-pay | Admitting: Neurology

## 2014-11-06 DIAGNOSIS — F17210 Nicotine dependence, cigarettes, uncomplicated: Secondary | ICD-10-CM | POA: Insufficient documentation

## 2014-11-06 DIAGNOSIS — L500 Allergic urticaria: Secondary | ICD-10-CM

## 2014-11-06 DIAGNOSIS — J439 Emphysema, unspecified: Secondary | ICD-10-CM | POA: Insufficient documentation

## 2014-11-06 DIAGNOSIS — I700 Atherosclerosis of aorta: Secondary | ICD-10-CM | POA: Insufficient documentation

## 2014-11-07 ENCOUNTER — Telehealth: Payer: Self-pay | Admitting: Acute Care

## 2014-11-07 NOTE — Telephone Encounter (Signed)
I have called these results to Amber Scott. I explained that the scan was read as a Lung RADS 3 scan, probably benign findings, short term follow up recommended; includes nodules with a low likelihood of becoming a clinically active cancer. I explained that a 6 month short term follow up scan will be scheduled per the recommendation of the radiologist. This will be in April of 2017. I explained that we will call her to schedule the scan.She verbalized understanding of all of the above and had no further questions. She has my contact information in the event she has any questions in the future.

## 2014-11-20 ENCOUNTER — Telehealth: Payer: Self-pay

## 2014-11-20 NOTE — Telephone Encounter (Signed)
Prescription was sent to the pharmacy at once daily and patient takes twice daily.  Called pharmacy [facility] at [phone] to call in new rx but they were closed.  Nestor LewandowskyAmanda Coleman, RMA will call pharmacy in the morning and notify patient.

## 2014-11-20 NOTE — Telephone Encounter (Signed)
PATIENT CALLED TO SEE WHAT HAPPENED WHEN WE SENT HER PRESCRIPTION OVER TO [facility]. SHE USUALLY TAKES THE CETIRIZINE 10MG  2X A DAY MORNING AND NIGHT. WHEN IT WAS SENT TO [facility] IT WAS CHANGED TO 1X A DAY AND SHE NEEDS IT TO BE CHANGED BACK AND SHE NEEDS A REFILL.  PLEASE ADVISE  THANKS

## 2014-11-21 NOTE — Telephone Encounter (Deleted)
Called [facility] and gave verbal prescription for Cetirizine 10mg  #180 with one additional refill. Patient notified as well.

## 2014-11-21 NOTE — Telephone Encounter (Signed)
Called [facility] to send in prescription for Zyrtec twice daily with one refill. I was informed from pharmacist that they did not do twice daily for Zyrtec. Patient could get prescription from another pharmacy for twice daily or purchase the medication OTC. Called and notified patient and she will call us back if she needs to send RX to a different pharmacy.

## 2014-12-12 ENCOUNTER — Telehealth: Payer: Self-pay | Admitting: Pulmonary Disease

## 2014-12-12 ENCOUNTER — Other Ambulatory Visit: Payer: Self-pay | Admitting: Allergy and Immunology

## 2014-12-12 MED ORDER — TIOTROPIUM BROMIDE MONOHYDRATE 18 MCG IN CAPS: 18.0000 ug | ORAL_CAPSULE | Freq: Every day | RESPIRATORY_TRACT | Status: DC

## 2014-12-12 MED ORDER — CETIRIZINE HCL 10 MG PO TABS: 10.0000 mg | ORAL_TABLET | Freq: Two times a day (BID) | ORAL | Status: DC

## 2014-12-12 NOTE — Telephone Encounter (Signed)
Called and spoke to pt. Pt stated she received information stating [facility] is missing the Spiriva rx and it is needing to be faxed with 11 refills. Called [facility] at [phone] and spoke to [place] and she stated the pt is currently enrolled and the last shipment was on 12/10/14 but they are needing a new rx of spiriva faxed to [phone] with pt's name, DOB, and shipping address on the cover letter. Rx printed and given to Jess to have PM sign. Will hold in triage till rx is received back to fax.

## 2014-12-12 NOTE — Telephone Encounter (Signed)
Called and notified patient we sent in 90 day supply for cetirizine bid. Patient did have a problem with bid in the past and has spoke with the pharmacy since then. Patient will call us back with any questions.

## 2014-12-12 NOTE — Telephone Encounter (Signed)
Pt called about a refill on her cetirizine hcl 10  Takes 2x a day. Needs 3 month supply each time. [phone].pharmacy is [facility] in [place] [phone].

## 2014-12-13 ENCOUNTER — Other Ambulatory Visit: Payer: Self-pay | Admitting: Neurology

## 2014-12-13 ENCOUNTER — Ambulatory Visit (INDEPENDENT_AMBULATORY_CARE_PROVIDER_SITE_OTHER): Payer: Self-pay | Admitting: *Deleted

## 2014-12-13 DIAGNOSIS — L500 Allergic urticaria: Secondary | ICD-10-CM

## 2014-12-13 MED ORDER — EPINEPHRINE 0.3 MG/0.3ML IJ SOAJ: 0.3000 mg | Freq: Once | INTRAMUSCULAR | Status: DC

## 2014-12-13 NOTE — Telephone Encounter (Signed)
Amber Scott was this signed and given back to you?

## 2014-12-16 NOTE — Telephone Encounter (Signed)
I'm sorry, yes this was signed and faxed on 12/2

## 2014-12-16 NOTE — Telephone Encounter (Signed)
Called spoke with pt and made aware. She needed nothing further 

## 2014-12-16 NOTE — Telephone Encounter (Signed)
Looked in Dr. Shirlee MoreMannam's folders and did not see RX for Spiriva. Sharlynn OliphantJess - has this already been done?   Please advise.

## 2014-12-23 ENCOUNTER — Telehealth: Payer: Self-pay | Admitting: Pulmonary Disease

## 2014-12-23 NOTE — Telephone Encounter (Signed)
TP had a cancellation tomorrow at 11:45am. Pt has been scheduled at this time. Nothing further was needed.

## 2014-12-24 ENCOUNTER — Ambulatory Visit (INDEPENDENT_AMBULATORY_CARE_PROVIDER_SITE_OTHER): Payer: No Typology Code available for payment source | Admitting: Adult Health

## 2014-12-24 ENCOUNTER — Encounter: Payer: Self-pay | Admitting: Adult Health

## 2014-12-24 ENCOUNTER — Other Ambulatory Visit (INDEPENDENT_AMBULATORY_CARE_PROVIDER_SITE_OTHER): Payer: No Typology Code available for payment source

## 2014-12-24 VITALS — BP 118/80 | HR 74 | Temp 97.7°F | Ht 65.0 in | Wt 220.0 lb

## 2014-12-24 DIAGNOSIS — R0602 Shortness of breath: Secondary | ICD-10-CM

## 2014-12-24 DIAGNOSIS — R0600 Dyspnea, unspecified: Secondary | ICD-10-CM

## 2014-12-24 DIAGNOSIS — J449 Chronic obstructive pulmonary disease, unspecified: Secondary | ICD-10-CM

## 2014-12-24 LAB — BRAIN NATRIURETIC PEPTIDE: Pro B Natriuretic peptide (BNP): 30 pg/mL (ref 0.0–100.0)

## 2014-12-24 MED ORDER — FLUTICASONE FUROATE-VILANTEROL 200-25 MCG/INH IN AEPB: 1.0000 | INHALATION_SPRAY | Freq: Every day | RESPIRATORY_TRACT | Status: DC

## 2014-12-24 NOTE — Progress Notes (Signed)
Subjective:    Patient ID: Amber Scott, female    DOB: 1955-04-16, 59 y.o.   MRN: [id number]  HPI 59 year old female, smoker with COPD And urticaria   12/13/2016post hospital follow-up Patient returns for a post hospital follow-up Patient was recently admitted to [facility] November 21 of November 23 for COPD exacerbation. She was treated with IV antibiotics and steroids. She was discharged on Levaquin. Hospital records were obtained and reviewed. CT chest was negative for PE. Troponin neg.  No BNP noted on records.  Since discharge she  is remains weak and sob.  Pt c/o chest congestion/tightness, prod cough of white mucus during early mornings, SOB and wheezing with activity.  Has finished abx ans steroids  Denies any sinus pressure/drainage, fever, nausea or vomiting.  Still smoking but has cut back . Trying to quit.  Wt is up 20lb over last year.  Had low dose CT chest 11/06/14 was neg.  Spirometry today shows mild COPD with FEV1 78%, ratio 64, FVC 97%, mid flows 42% No desats with walk test in office      Past Medical History  Diagnosis Date  . Hypertension    Current Outpatient Prescriptions on File Prior to Visit  Medication Sig Dispense Refill  . acetaminophen (TYLENOL) 500 MG tablet Take 500 mg by mouth every 6 (six) hours as needed.    Marland Kitchen amLODipine (NORVASC) 10 MG tablet Take 10 mg by mouth daily.  3  . aspirin 81 MG tablet Take 81 mg by mouth daily.    . cetirizine (ZYRTEC) 10 MG tablet Take 1 tablet (10 mg total) by mouth 2 (two) times daily. 180 tablet 0  . diphenhydrAMINE (BENADRYL) 2 % cream Apply topically as needed for itching.    . diphenhydrAMINE (BENADRYL) 25 MG tablet Take by mouth. As needed    . Docusate Calcium (STOOL SOFTENER PO) Take 2 capsules by mouth daily.    Marland Kitchen ENSURE (ENSURE) Take 237 mLs by mouth 2 (two) times daily.    Marland Kitchen EPINEPHrine (EPIPEN 2-PAK) 0.3 mg/0.3 mL IJ SOAJ injection Inject 0.3 mLs (0.3 mg total) into the muscle  once. 2 Device 1  . montelukast (SINGULAIR) 10 MG tablet Take 10 mg by mouth every evening.  5  . omeprazole (PRILOSEC) 20 MG capsule Take 20 mg by mouth 2 (two) times daily.    . ranitidine (ZANTAC) 150 MG tablet Take 150 mg by mouth 2 (two) times daily.   5  . sertraline (ZOLOFT) 50 MG tablet Take 50 mg by mouth daily.  1  . triamcinolone cream (KENALOG) 0.1 % Apply 1 application topically 2 (two) times daily.  0  . fluticasone (FLONASE) 50 MCG/ACT nasal spray Place 2 sprays into both nostrils daily as needed.    . tiotropium (SPIRIVA) 18 MCG inhalation capsule Place 1 capsule (18 mcg total) into inhaler and inhale daily. (Patient not taking: Reported on 12/24/2014) 30 capsule 11   Current Facility-Administered Medications on File Prior to Visit  Medication Dose Route Frequency Provider Last Rate Last Dose  . omalizumab Geoffry Paradise) injection 300 mg  300 mg Subcutaneous Q28 days Jessica Priest, MD   300 mg at 12/13/14 0865     Review of Systems  Constitutional:   No  weight loss, night sweats,  Fevers, chills, fatigue, or  lassitude.  HEENT:   No headaches,  Difficulty swallowing,  Tooth/dental problems, or  Sore throat,  No sneezing, itching, ear ache, nasal congestion, post nasal drip,   CV:  No chest pain,  Orthopnea, PND, swelling in lower extremities, anasarca, dizziness, palpitations, syncope.   GI  No heartburn, indigestion, abdominal pain, nausea, vomiting, diarrhea, change in bowel habits, loss of appetite, bloody stools.   Resp:    No chest wall deformity  Skin: no rash or lesions.  GU: no dysuria, change in color of urine, no urgency or frequency.  No flank pain, no hematuria   MS:  No joint pain or swelling.  No decreased range of motion.  No back pain.  Psych:  No change in mood or affect. No depression or anxiety.  No memory loss.         Objective:   Physical Exam  Filed Vitals:   12/24/14 1152  BP: 118/80  Pulse: 74  Temp: 97.7 F (36.5 C)    TempSrc: Oral  Height: 5\' 5"  (1.651 m)  Weight: 220 lb (99.791 kg)  SpO2: 93%    GEN: A/Ox3; pleasant , NAD, obese   HEENT:  [place]/AT,  EACs-clear, TMs-wnl, NOSE-clear, THROAT-clear, no lesions, no postnasal drip or exudate noted.   NECK:  Supple w/ fair ROM; no JVD; normal carotid impulses w/o bruits; no thyromegaly or nodules palpated; no lymphadenopathy.  RESP  Clear  P & A; w/o, wheezes/ rales/ or rhonchi.no accessory muscle use, no dullness to percussion  CARD:  RRR, no m/r/g  , no peripheral edema, pulses intact, no cyanosis or clubbing.  GI:   Soft & nt; nml bowel sounds; no organomegaly or masses detected.  Musco: Warm bil, no deformities or joint swelling noted.   Neuro: alert, no focal deficits noted.    Skin: Warm, no lesions or rashes         Assessment & Plan:

## 2014-12-24 NOTE — Patient Instructions (Addendum)
Increase BREO 200mcg  1 puff daily  Continue on INCRUSE daily .  Refer to cardiology .  Labs today  Continue to work on not smoking .  Follow up Dr. Isaiah SergeMannam in 6-8 weeks and As needed   Please contact office for sooner follow up if symptoms do not improve or worsen or seek emergency care

## 2014-12-24 NOTE — Assessment & Plan Note (Addendum)
?  secondary to slow to resolve COPD flare , obesity and decondiitoning  CT chest was neg for PE . , no desats in office with walk test.  Check bnp and refer to cards

## 2014-12-24 NOTE — Assessment & Plan Note (Addendum)
Slow to resolve exacerbation  Dyspnea is out of proportion to her level of COPD  No desats with ambulation today in office  Check BNP  Will refer to cardiology to evaluatie for possible undelrying cardiac dz -increased RF with obesity , smoker   Plan  ncrease BREO 200mcg  1 puff daily  Continue on INCRUSE daily .  Refer to cardiology .  Labs today  Continue to work on not smoking .  Follow up Dr. Isaiah SergeMannam in 6-8 weeks and As needed   Please contact office for sooner follow up if symptoms do not improve or worsen or seek emergency care

## 2014-12-24 NOTE — Addendum Note (Signed)
Addended by: Karalee HeightOX, Niza Soderholm P on: 12/24/2014 12:37 PM   Modules accepted: Orders

## 2015-01-10 ENCOUNTER — Ambulatory Visit (INDEPENDENT_AMBULATORY_CARE_PROVIDER_SITE_OTHER): Payer: Self-pay | Admitting: Neurology

## 2015-01-10 DIAGNOSIS — J4540 Moderate persistent asthma, uncomplicated: Secondary | ICD-10-CM

## 2015-01-10 DIAGNOSIS — J45901 Unspecified asthma with (acute) exacerbation: Secondary | ICD-10-CM

## 2015-01-29 ENCOUNTER — Encounter: Payer: Self-pay | Admitting: *Deleted

## 2015-01-29 ENCOUNTER — Ambulatory Visit (INDEPENDENT_AMBULATORY_CARE_PROVIDER_SITE_OTHER): Payer: No Typology Code available for payment source | Admitting: Cardiology

## 2015-01-29 ENCOUNTER — Encounter: Payer: Self-pay | Admitting: Cardiology

## 2015-01-29 VITALS — BP 132/90 | HR 78 | Ht 64.5 in | Wt 221.1 lb

## 2015-01-29 DIAGNOSIS — I700 Atherosclerosis of aorta: Secondary | ICD-10-CM

## 2015-01-29 DIAGNOSIS — R0600 Dyspnea, unspecified: Secondary | ICD-10-CM

## 2015-01-29 DIAGNOSIS — I2584 Coronary atherosclerosis due to calcified coronary lesion: Secondary | ICD-10-CM

## 2015-01-29 DIAGNOSIS — I2510 Atherosclerotic heart disease of native coronary artery without angina pectoris: Secondary | ICD-10-CM

## 2015-01-29 DIAGNOSIS — J449 Chronic obstructive pulmonary disease, unspecified: Secondary | ICD-10-CM

## 2015-01-29 DIAGNOSIS — I208 Other forms of angina pectoris: Secondary | ICD-10-CM

## 2015-01-29 NOTE — Progress Notes (Addendum)
Cardiology Office Note    Date:  01/29/2015   ID:  Amber Scott, DOB Mar 29, 1955, MRN [id number]  PCP:  Bernerd Limbo  Cardiologist:   Donato Schultz, MD     History of Present Illness:  Amber Scott is a 60 y.o. female with COPD here at the request ofTammy Perrett for the evaluation of dyspnea that seems out of portion to COPD.  Back in January through March of 2015 she suffered with severe pneumonia, pneumococcal pneumonia. Eventually had a bronchoscopy performed by Dr. Delford Field.  She has last been hospitalized [place], previously in emergency room with oxygen saturations in the 70 range.  Currently it is challenging for her to keep her oxygen saturations greater than 91.  She continues to smoke but she uses the [employer] program and this has improved.  Mild left plaque carotid-life screen exam  Minimal coronary calcification.CT scan personally viewed.  Left leg pain. NSAID. Right arm shoulder hurts.   Has had some chest tightness/congestion, mucus production.  No prior coronary disease history.  Past Medical History  Diagnosis Date  . Hypertension     Past Surgical History  Procedure Laterality Date  . Cholecystectomy    . Vesicovaginal fistula closure w/ tah    . Video bronchoscopy Bilateral 04/10/2013    Procedure: VIDEO BRONCHOSCOPY WITH FLUORO;  Surgeon: Storm Frisk, MD;  Location: [facility];  Service: Cardiopulmonary;  Laterality: Bilateral;    Outpatient Prescriptions Prior to Visit  Medication Sig Dispense Refill  . acetaminophen (TYLENOL) 500 MG tablet Take 500 mg by mouth every 6 (six) hours as needed for mild pain, moderate pain, fever or headache.     . albuterol (PROVENTIL) (2.5 MG/3ML) 0.083% nebulizer solution Take 2.5 mg by nebulization every 6 (six) hours as needed for wheezing or shortness of breath.    Marland Kitchen amLODipine (NORVASC) 10 MG tablet Take 10 mg by mouth daily.  3  . aspirin 81 MG tablet Take 81 mg by mouth  daily.    . cetirizine (ZYRTEC) 10 MG tablet Take 1 tablet (10 mg total) by mouth 2 (two) times daily. 180 tablet 0  . diphenhydrAMINE (BENADRYL) 2 % cream Apply 1 application topically as directed.     . diphenhydrAMINE (BENADRYL) 25 MG tablet Take 25 mg by mouth every 8 (eight) hours as needed for itching, allergies or sleep. As needed    . Docusate Calcium (STOOL SOFTENER PO) Take 2 capsules by mouth daily.    Marland Kitchen ENSURE (ENSURE) Take 237 mLs by mouth 2 (two) times daily.    Marland Kitchen EPINEPHrine (EPIPEN 2-PAK) 0.3 mg/0.3 mL IJ SOAJ injection Inject 0.3 mLs (0.3 mg total) into the muscle once. 2 Device 1  . fluticasone (FLONASE) 50 MCG/ACT nasal spray Place 2 sprays into both nostrils daily as needed for allergies or rhinitis.     . Fluticasone Furoate-Vilanterol (BREO ELLIPTA) 200-25 MCG/INH AEPB Inhale 1 puff into the lungs daily. 1 each 5  . montelukast (SINGULAIR) 10 MG tablet Take 10 mg by mouth every evening.  5  . nicotine (NICODERM CQ - DOSED IN MG/24 HOURS) 21 mg/24hr patch Place 21 mg onto the skin daily.    Marland Kitchen nystatin (MYCOSTATIN) 100000 UNIT/ML suspension Take 5 mLs by mouth as directed.     Marland Kitchen omeprazole (PRILOSEC) 20 MG capsule Take 20 mg by mouth 2 (two) times daily.    . ranitidine (ZANTAC) 150 MG tablet Take 150 mg by mouth 2 (two) times daily.  5  . sertraline (ZOLOFT) 50 MG tablet Take 50 mg by mouth daily.  1  . triamcinolone cream (KENALOG) 0.1 % Apply 1 application topically 2 (two) times daily.  0  . Umeclidinium Bromide 62.5 MCG/INH AEPB Inhale 1 puff into the lungs daily.     Facility-Administered Medications Prior to Visit  Medication Dose Route Frequency Provider Last Rate Last Dose  . omalizumab Geoffry Paradise) injection 300 mg  300 mg Subcutaneous Q28 days Jessica Priest, MD   300 mg at 01/10/15 1610     Allergies:   Review of patient's allergies indicates no known allergies.   Social History   Social History  . Marital Status: Divorced    Spouse Name: N/A  . Number of  Children: N/A  . Years of Education: N/A   Occupational History  . Retired    Social History Main Topics  . Smoking status: Current Some Day Smoker -- 2.00 packs/day for 37 years    Types: Cigarettes    Start date: 01/11/1973  . Smokeless tobacco: Never Used  . Alcohol Use: 0.0 oz/week    0 Standard drinks or equivalent per week     Comment: wine nightly   . Drug Use: No  . Sexual Activity: Not Asked   Other Topics Concern  . None   Social History Narrative     Family History:  The patient's family history includes Emphysema in her maternal uncle; Lung cancer in her maternal uncle and mother.   ROS:   Please see the history of present illness.    ROS, no syncope, no bleeding, no orthopnea, no PND All other systems reviewed and are negative.   PHYSICAL EXAM:   VS:  BP 132/90 mmHg  Pulse 78  Ht 5' 4.5" (1.638 m)  Wt 221 lb 1.9 oz (100.299 kg)  BMI 37.38 kg/m2  SpO2 91%   GEN: Well nourished, well developed, in no acute distress HEENT: normal Neck: no JVD, carotid bruits, or masses Cardiac: RRR; no murmurs, rubs, or gallops,no edema  Respiratory:  Poor air movement bilaterally, no active wheezing GI: soft, nontender, nondistended, + BS, obese MS: no deformity or atrophy Skin: warm and dry, no rash Neuro:  Alert and Oriented x 3, Strength and sensation are intact Psych: euthymic mood, full affect  Wt Readings from Last 3 Encounters:  01/29/15 221 lb 1.9 oz (100.299 kg)  12/24/14 220 lb (99.791 kg)  10/17/14 219 lb 12.8 oz (99.701 kg)      Studies/Labs Reviewed:   EKG:  EKG is ordered today.  The ekg ordered today demonstrates 01/29/15-normal rhythm, 69, possible old septal infarct pattern. Personally viewed.  Recent Labs: 12/24/2014: Pro B Natriuretic peptide (BNP) 30.0   Lipid Panel No results found for: CHOL, TRIG, HDL, CHOLHDL, VLDL, LDLCALC, LDLDIRECT  Additional studies/ records that were reviewed today include:  CT scan of chest, lung screening  personally viewed-minimal coronary calcification seen along the LAD distribution. Aortic atherosclerosis also noted descending.    ASSESSMENT:    1. Angina decubitus (HCC)   2. Dyspnea   3. Coronary artery calcification   4. Aortic atherosclerosis (HCC)   5. Obstructive chronic bronchitis without exacerbation (HCC)      PLAN:  In order of problems listed above:  1. We will go ahead and check a nuclear stress test. Evaluate for ischemia. 2. We will check an echocardiogram to ensure proper structure and function, valvular anatomy. I do not appreciate any significant murmurs on exam. 3. Minimal coronary  artery calcium noted on the LAD. Very mild outlining the arterial wall. 4. Aortic atherosclerosis noted on CT scan. Continue with secondary prevention. Consider statin therapy. 5. COPD noted. Air movement limited on exam today. Poor oxygen saturation per pulmonary.    Medication Adjustments/Labs and Tests Ordered: Current medicines are reviewed at length with the patient today.  Concerns regarding medicines are outlined above.  Medication changes, Labs and Tests ordered today are listed in the Patient Instructions below. There are no Patient Instructions on file for this visit.     Mathews Robinsons, MD  01/29/2015 3:34 PM    [facility] [address] [place], [place], [place]  [address] Phone: [phone]; Fax: [phone]

## 2015-01-29 NOTE — Patient Instructions (Signed)
Medication Instructions:  The current medical regimen is effective;  continue present plan and medications.  Testing/Procedures: Your physician has requested that you have an echocardiogram. Echocardiography is a painless test that uses sound waves to create images of your heart. It provides your doctor with information about the size and shape of your heart and how well your heart's chambers and valves are working. This procedure takes approximately one hour. There are no restrictions for this procedure.  Your physician has requested that you have a lexiscan myoview. For further information please visit [email or web address] Please follow instruction sheet, as given.  Follow-Up: Follow up in 1 year with Dr. Anne Fu.  You will receive a letter in the mail 2 months before you are due.  Please call us when you receive this letter to schedule your follow up appointment.  If you need a refill on your cardiac medications before your next appointment, please call your pharmacy.  Thank you for choosing [facility]!!

## 2015-01-30 ENCOUNTER — Other Ambulatory Visit: Payer: Self-pay | Admitting: *Deleted

## 2015-01-30 MED ORDER — EPINEPHRINE 0.3 MG/0.3ML IJ SOAJ: INTRAMUSCULAR | Status: DC

## 2015-01-30 NOTE — Telephone Encounter (Signed)
FAXED TO [phone] PER PT REQUEST HAS PAPERWORK TO RECEIVE FREE EPIPEN

## 2015-02-04 ENCOUNTER — Ambulatory Visit (INDEPENDENT_AMBULATORY_CARE_PROVIDER_SITE_OTHER): Payer: No Typology Code available for payment source | Admitting: Pulmonary Disease

## 2015-02-04 ENCOUNTER — Encounter: Payer: Self-pay | Admitting: Pulmonary Disease

## 2015-02-04 VITALS — BP 136/88 | HR 74 | Ht 64.5 in | Wt 221.2 lb

## 2015-02-04 DIAGNOSIS — F17200 Nicotine dependence, unspecified, uncomplicated: Secondary | ICD-10-CM

## 2015-02-04 DIAGNOSIS — J449 Chronic obstructive pulmonary disease, unspecified: Secondary | ICD-10-CM

## 2015-02-04 NOTE — Patient Instructions (Signed)
Continue on current inhaler regimen Return in 3 months for follow-up

## 2015-02-04 NOTE — Progress Notes (Signed)
Subjective:    Patient ID: Amber Scott, female    DOB: 12-14-1955, 60 y.o.   MRN: [id number]    HPI Follow-up for COPD.  Amber Scott is a 60 year old patient with heavy smoking history, COPD. She is a former patient of Dr. Delford Field. She was evaluated in 2015 for recurrent pneumonia. A CT on 03/16/13 showed a lingular opacity. She underwent bronchoscopy for this. There is no evidence of malignancy and the cultures grew hemolytic streptococci. She was treated with antibiotics with improvement in symptoms. She is on Xolair per dr. Ardyth Harps for recurrent utricaria.   She was hospitalized in [facility] December 02, 2014 for COPD exacerbation. She was treated with IV antibiotics and steroids. She was discharged on Levaquin. Spiriva was changed to breo elipta and incruse. CT chest was negative for PE. Troponin neg. No BNP noted on records. She was referred to cardiology for further evaluation and is scheduled to undergo a stress test and echocardiogram of the heart.  Social history: She has smoked 2 packs per day at least for 37 years. She is trying to cut and is down to 5 per day. 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 There are no obvious exposures at work or at home.  Family History: Non contributory  Patient Active Problem List   Diagnosis Date Noted  . Dyspnea 12/24/2014  . Acute bronchitis 04/02/2014  . Allergic rhinitis 08/20/2013  . Cervical radiculitis 08/20/2013  . Acid reflux 08/20/2013  . Cervical pain 08/20/2013  . Vaginal vault smear abnormal 08/20/2013  . Obstructive chronic bronchitis without exacerbation (HCC) 08/20/2013  . Hives 08/20/2013  . At high risk for falls 05/11/2013  . Tobacco use disorder 03/27/2013  . Hypertension   . Anxiety 11/03/2010    Current outpatient prescriptions:  .  albuterol (PROVENTIL) (2.5 MG/3ML) 0.083% nebulizer solution, Take 2.5 mg by nebulization every 6 (six) hours as needed for wheezing or shortness  of breath., Disp: , Rfl:  .  amLODipine (NORVASC) 10 MG tablet, Take 10 mg by mouth daily., Disp: , Rfl: 3 .  aspirin 81 MG tablet, Take 81 mg by mouth daily., Disp: , Rfl:  .  cetirizine (ZYRTEC) 10 MG tablet, Take 1 tablet (10 mg total) by mouth 2 (two) times daily., Disp: 180 tablet, Rfl: 0 .  diphenhydrAMINE (BENADRYL) 2 % cream, Apply 1 application topically as directed. , Disp: , Rfl:  .  diphenhydrAMINE (BENADRYL) 25 MG tablet, Take 25 mg by mouth every 8 (eight) hours as needed for itching, allergies or sleep. As needed, Disp: , Rfl:  .  Docusate Calcium (STOOL SOFTENER PO), Take 2 capsules by mouth daily., Disp: , Rfl:  .  ENSURE (ENSURE), Take 237 mLs by mouth 2 (two) times daily., Disp: , Rfl:  .  EPINEPHrine (EPIPEN 2-PAK) 0.3 mg/0.3 mL IJ SOAJ injection, USE AS DIRECTED FOR LIFE-THREATENING ALLERGIC REACTIONS, Disp: 2 Device, Rfl: 1 .  Fluticasone Furoate-Vilanterol (BREO ELLIPTA) 200-25 MCG/INH AEPB, Inhale 1 puff into the lungs daily., Disp: 1 each, Rfl: 5 .  montelukast (SINGULAIR) 10 MG tablet, Take 10 mg by mouth every evening., Disp: , Rfl: 5 .  nicotine (NICODERM CQ - DOSED IN MG/24 HOURS) 21 mg/24hr patch, Place 21 mg onto the skin daily., Disp: , Rfl:  .  omalizumab (XOLAIR) 150 MG injection, Inject into the skin every 28 (twenty-eight) days., Disp: , Rfl:  .  omeprazole (PRILOSEC) 20 MG capsule, Take 20 mg by mouth  2 (two) times daily., Disp: , Rfl:  .  ranitidine (ZANTAC) 150 MG tablet, Take 150 mg by mouth 2 (two) times daily. , Disp: , Rfl: 5 .  sertraline (ZOLOFT) 50 MG tablet, Take 50 mg by mouth daily., Disp: , Rfl: 1 .  triamcinolone cream (KENALOG) 0.1 %, Apply 1 application topically 2 (two) times daily., Disp: , Rfl: 0 .  Umeclidinium Bromide 62.5 MCG/INH AEPB, Inhale 1 puff into the lungs daily., Disp: , Rfl:   Current facility-administered medications:  .  omalizumab Geoffry Paradise) injection 300 mg, 300 mg, Subcutaneous, Q28 days, Jessica Priest, MD, 300 mg at 01/10/15  1610  Review of Systems   Complains of dry nonproductive cough, dyspnea on exertion No sputum production, wheezing, hemoptysis. No fevers, chills, malaise, fatigue, weight loss. No chest pain, palpitations. No nausea, vomiting, diarrhea. All other review of systems are negative.  Spirometry (03/27/13) FVC 3.69 (115%) FEV1 1.96 (76%) F/F 53  Spirometry (12/24/14) FVC 3.20 (97%) FEV1 2.05 (78%) F/F 64     Objective:   Physical Exam Blood pressure 136/88, pulse 74, height 5' 4.5" (1.638 m), weight 221 lb 3.2 oz (100.336 kg), SpO2 91 %. General: No distress, obese Neuro: No focal deficits HEENT: PERRLA, Moist mucus membranes Cardiovascular: RRR, S1 S2 Lungs: Clear to auscultation. No wheeze or crackles Abdomen: Soft, BS+ Skin: No rash/bruising.    Assessment & Plan:  #1 Moderate COPD. Recent exacerbation. She is now on Breo elipta and Incruse. She still feels congested and weak. I will check ambulatory O2 sats. Stress test and echo by cardiology is pending.  #2 Smoking. Smoking cessation encouraged. Lung cancer screening CT shows RML density> likely benign findings. Repeat CT is pending.   #3 Allergies, utricaria. On Xolair.  Chilton Greathouse MD [facility] Pager [phone] If no answer or after 3pm call: [phone] 02/04/2015, 2:45 PM

## 2015-02-06 ENCOUNTER — Telehealth (HOSPITAL_COMMUNITY): Payer: Self-pay | Admitting: *Deleted

## 2015-02-06 NOTE — Telephone Encounter (Signed)
Patient given detailed instructions per Myocardial Perfusion Study Information Sheet for the test on 02/11/15 at 945. Patient notified to arrive 15 minutes early and that it is imperative to arrive on time for appointment to keep from having the test rescheduled.  If you need to cancel or reschedule your appointment, please call the office within 24 hours of your appointment. Failure to do so may result in a cancellation of your appointment, and a $50 no show fee. Patient verbalized understanding.Antionette Char, RN .tc

## 2015-02-07 ENCOUNTER — Ambulatory Visit (INDEPENDENT_AMBULATORY_CARE_PROVIDER_SITE_OTHER): Payer: Self-pay | Admitting: Neurology

## 2015-02-07 DIAGNOSIS — J4540 Moderate persistent asthma, uncomplicated: Secondary | ICD-10-CM

## 2015-02-11 ENCOUNTER — Other Ambulatory Visit: Payer: Self-pay

## 2015-02-11 ENCOUNTER — Ambulatory Visit (HOSPITAL_COMMUNITY): Payer: No Typology Code available for payment source | Attending: Cardiology

## 2015-02-11 ENCOUNTER — Ambulatory Visit (HOSPITAL_BASED_OUTPATIENT_CLINIC_OR_DEPARTMENT_OTHER): Payer: No Typology Code available for payment source

## 2015-02-11 DIAGNOSIS — I10 Essential (primary) hypertension: Secondary | ICD-10-CM | POA: Insufficient documentation

## 2015-02-11 DIAGNOSIS — I059 Rheumatic mitral valve disease, unspecified: Secondary | ICD-10-CM | POA: Insufficient documentation

## 2015-02-11 DIAGNOSIS — I208 Other forms of angina pectoris: Secondary | ICD-10-CM

## 2015-02-11 DIAGNOSIS — F17200 Nicotine dependence, unspecified, uncomplicated: Secondary | ICD-10-CM | POA: Insufficient documentation

## 2015-02-11 DIAGNOSIS — R0600 Dyspnea, unspecified: Secondary | ICD-10-CM

## 2015-02-11 DIAGNOSIS — I517 Cardiomegaly: Secondary | ICD-10-CM | POA: Insufficient documentation

## 2015-02-11 MED ORDER — TECHNETIUM TC 99M SESTAMIBI GENERIC - CARDIOLITE
32.8000 | Freq: Once | INTRAVENOUS | Status: AC | PRN
  Administered 2015-02-11: 32.8 via INTRAVENOUS

## 2015-02-18 ENCOUNTER — Ambulatory Visit (HOSPITAL_COMMUNITY): Payer: No Typology Code available for payment source | Attending: Cardiovascular Disease

## 2015-02-18 LAB — MYOCARDIAL PERFUSION IMAGING
CHL CUP RESTING HR STRESS: 60 {beats}/min
LV sys vol: 32 mL
LVDIAVOL: 93 mL
NUC STRESS TID: 1.03
Peak HR: 90 {beats}/min
RATE: 0.23
SDS: 2
SRS: 5
SSS: 4

## 2015-02-18 MED ORDER — TECHNETIUM TC 99M SESTAMIBI GENERIC - CARDIOLITE
32.2000 | Freq: Once | INTRAVENOUS | Status: AC | PRN
  Administered 2015-02-18: 32.2 via INTRAVENOUS

## 2015-03-05 ENCOUNTER — Ambulatory Visit: Payer: Self-pay | Admitting: Allergy and Immunology

## 2015-03-19 ENCOUNTER — Encounter: Payer: Self-pay | Admitting: Allergy and Immunology

## 2015-03-19 ENCOUNTER — Ambulatory Visit (INDEPENDENT_AMBULATORY_CARE_PROVIDER_SITE_OTHER): Payer: Self-pay

## 2015-03-19 ENCOUNTER — Ambulatory Visit (INDEPENDENT_AMBULATORY_CARE_PROVIDER_SITE_OTHER): Payer: Self-pay | Admitting: Allergy and Immunology

## 2015-03-19 VITALS — BP 120/90 | HR 88 | Resp 18

## 2015-03-19 DIAGNOSIS — L500 Allergic urticaria: Secondary | ICD-10-CM

## 2015-03-19 NOTE — Patient Instructions (Signed)
   1. Continue Xolair and EpiPen  2. Continue cetirizine 10 mg twice a day and ranitidine 150 mg twice a day  3. Stop montelukast  4. Return to clinic in 6 months

## 2015-03-19 NOTE — Progress Notes (Signed)
Follow-up Note  Referring Provider: Bernerd LimboMitchell, John, MD Primary Provider: Bernerd LimboMITCHELL, JOHN Date of Office Visit: 03/19/2015  Subjective:   Amber GivensCynthia C Scott (DOB: 12/09/1955) is a 60 y.o. female [facility] in re-evaluation of the following:  HPI Comments: Amber BeechamCynthia returns to this clinic in evaluation of her urticaria treated with Xolair. She started Xolair in September and she has had a dramatic response. She has very minimal problems with her urticaria at this point. She may have a very small red mark ear and there that is not very itchy. She continues on cetirizine twice a day and ranitidine twice a day and montelukast 10 mg daily.   Outpatient Prescriptions Prior to Visit  Medication Sig Dispense Refill  . albuterol (PROVENTIL) (2.5 MG/3ML) 0.083% nebulizer solution Take 2.5 mg by nebulization every 6 (six) hours as needed for wheezing or shortness of breath.    Marland Kitchen. amLODipine (NORVASC) 10 MG tablet Take 10 mg by mouth daily.  3  . aspirin 81 MG tablet Take 81 mg by mouth daily.    . cetirizine (ZYRTEC) 10 MG tablet Take 1 tablet (10 mg total) by mouth 2 (two) times daily. 180 tablet 0  . diphenhydrAMINE (BENADRYL) 2 % cream Apply 1 application topically as directed.     . diphenhydrAMINE (BENADRYL) 25 MG tablet Take 25 mg by mouth every 8 (eight) hours as needed for itching, allergies or sleep. As needed    . Docusate Calcium (STOOL SOFTENER PO) Take 2 capsules by mouth daily.    Marland Kitchen. ENSURE (ENSURE) Take 237 mLs by mouth 2 (two) times daily.    Marland Kitchen. EPINEPHrine (EPIPEN 2-PAK) 0.3 mg/0.3 mL IJ SOAJ injection USE AS DIRECTED FOR LIFE-THREATENING ALLERGIC REACTIONS 2 Device 1  . Fluticasone Furoate-Vilanterol (BREO ELLIPTA) 200-25 MCG/INH AEPB Inhale 1 puff into the lungs daily. 1 each 5  . montelukast (SINGULAIR) 10 MG tablet Take 10 mg by mouth every evening.  5  . nicotine (NICODERM CQ - DOSED IN MG/24 HOURS) 21 mg/24hr patch Place 21 mg onto  the skin daily.    Marland Kitchen. omalizumab (XOLAIR) 150 MG injection Inject into the skin every 28 (twenty-eight) days.    Marland Kitchen. omeprazole (PRILOSEC) 20 MG capsule Take 20 mg by mouth 2 (two) times daily.    . ranitidine (ZANTAC) 150 MG tablet Take 150 mg by mouth 2 (two) times daily.   5  . sertraline (ZOLOFT) 50 MG tablet Take 50 mg by mouth daily.  1  . triamcinolone cream (KENALOG) 0.1 % Apply 1 application topically 2 (two) times daily.  0  . Umeclidinium Bromide 62.5 MCG/INH AEPB Inhale 1 puff into the lungs daily.     Facility-Administered Medications Prior to Visit  Medication Dose Route Frequency Provider Last Rate Last Dose  . omalizumab Geoffry Paradise(XOLAIR) injection 300 mg  300 mg Subcutaneous Q28 days Jessica PriestEric J Kozlow, MD   300 mg at 03/19/15 1058    Past Medical History  Diagnosis Date  . Hypertension     Past Surgical History  Procedure Laterality Date  . Cholecystectomy    . Vesicovaginal fistula closure w/ tah    . Video bronchoscopy Bilateral 04/10/2013    Procedure: VIDEO BRONCHOSCOPY WITH FLUORO;  Surgeon: Storm FriskPatrick E Wright, MD;  Location: [facility];  Service: Cardiopulmonary;  Laterality: Bilateral;    No Known Allergies  Review of systems negative except as noted in HPI / PMHx or noted below:  Review of Systems  Constitutional: Negative.   HENT: Negative.   Eyes: Negative.   Respiratory: Negative.   Cardiovascular: Negative.   Gastrointestinal: Negative.   Genitourinary: Negative.   Musculoskeletal: Negative.        Fracture right ankle presently in a hard cast  Skin: Negative.   Neurological: Negative.   Endo/Heme/Allergies: Negative.   Psychiatric/Behavioral: Negative.      Objective:   Filed Vitals:   03/19/15 1108  BP: 120/90  Pulse: 88  Resp: 18          Physical Exam  Constitutional: She is well-developed, well-nourished, and in no distress.  HENT:  Head: Normocephalic.  Right Ear: Tympanic membrane, external ear and ear canal normal.  Left Ear: Tympanic  membrane, external ear and ear canal normal.  Nose: Nose normal. No mucosal edema or rhinorrhea.  Mouth/Throat: Uvula is midline, oropharynx is clear and moist and mucous membranes are normal. No oropharyngeal exudate.  Eyes: Conjunctivae are normal.  Neck: Trachea normal. No tracheal tenderness present. No tracheal deviation present. No thyromegaly present.  Cardiovascular: Normal rate, regular rhythm, S1 normal, S2 normal and normal heart sounds.   No murmur heard. Pulmonary/Chest: Breath sounds normal. No stridor. No respiratory distress. She has no wheezes. She has no rales.  Musculoskeletal: She exhibits no edema.  Right foot cast  Lymphadenopathy:       Head (right side): No tonsillar adenopathy present.       Head (left side): No tonsillar adenopathy present.    She has no cervical adenopathy.    She has no axillary adenopathy.  Neurological: She is alert. Gait normal.  Skin: No rash noted. She is not diaphoretic. No erythema. Nails show no clubbing.  Psychiatric: Mood and affect normal.    Diagnostics: None    Assessment and Plan:   1. Allergic urticaria     1. Continue Xolair and EpiPen  2. Continue cetirizine 10 mg twice a day and ranitidine 150 mg twice a day  3. Stop montelukast  4. Return to clinic in 6 months  [name] will continue on Xolair and we'll see if we can consolidate some of her medical treatment by discontinuing her montelukast at this point. I will see her back in this clinic in 6 months or earlier if there is a problem.  [name], MD [facility]

## 2015-04-01 ENCOUNTER — Other Ambulatory Visit: Payer: Self-pay | Admitting: Acute Care

## 2015-04-01 DIAGNOSIS — F17210 Nicotine dependence, cigarettes, uncomplicated: Secondary | ICD-10-CM

## 2015-04-16 ENCOUNTER — Ambulatory Visit (INDEPENDENT_AMBULATORY_CARE_PROVIDER_SITE_OTHER): Payer: Self-pay

## 2015-04-16 DIAGNOSIS — L500 Allergic urticaria: Secondary | ICD-10-CM

## 2015-04-16 DIAGNOSIS — L501 Idiopathic urticaria: Secondary | ICD-10-CM

## 2015-05-09 ENCOUNTER — Ambulatory Visit (HOSPITAL_BASED_OUTPATIENT_CLINIC_OR_DEPARTMENT_OTHER): Admission: RE | Admit: 2015-05-09 | Payer: No Typology Code available for payment source | Source: Ambulatory Visit

## 2015-05-12 ENCOUNTER — Ambulatory Visit: Payer: No Typology Code available for payment source | Admitting: Pulmonary Disease

## 2015-05-14 ENCOUNTER — Ambulatory Visit (INDEPENDENT_AMBULATORY_CARE_PROVIDER_SITE_OTHER): Payer: Self-pay

## 2015-05-14 DIAGNOSIS — L501 Idiopathic urticaria: Secondary | ICD-10-CM

## 2015-05-14 DIAGNOSIS — J4540 Moderate persistent asthma, uncomplicated: Secondary | ICD-10-CM

## 2015-05-15 ENCOUNTER — Ambulatory Visit (HOSPITAL_BASED_OUTPATIENT_CLINIC_OR_DEPARTMENT_OTHER)
Admission: RE | Admit: 2015-05-15 | Discharge: 2015-05-15 | Disposition: A | Discharge status: Home / Self Care | Payer: No Typology Code available for payment source | Source: Ambulatory Visit | Attending: Acute Care | Admitting: Acute Care

## 2015-05-15 DIAGNOSIS — J439 Emphysema, unspecified: Secondary | ICD-10-CM | POA: Insufficient documentation

## 2015-05-15 DIAGNOSIS — F17210 Nicotine dependence, cigarettes, uncomplicated: Secondary | ICD-10-CM

## 2015-05-15 DIAGNOSIS — R911 Solitary pulmonary nodule: Secondary | ICD-10-CM | POA: Insufficient documentation

## 2015-05-15 DIAGNOSIS — I700 Atherosclerosis of aorta: Secondary | ICD-10-CM | POA: Insufficient documentation

## 2015-05-15 DIAGNOSIS — I2510 Atherosclerotic heart disease of native coronary artery without angina pectoris: Secondary | ICD-10-CM | POA: Insufficient documentation

## 2015-05-19 ENCOUNTER — Encounter: Payer: Self-pay | Admitting: Pulmonary Disease

## 2015-05-19 ENCOUNTER — Ambulatory Visit (INDEPENDENT_AMBULATORY_CARE_PROVIDER_SITE_OTHER): Payer: No Typology Code available for payment source | Admitting: Pulmonary Disease

## 2015-05-19 VITALS — BP 138/78 | HR 64 | Ht 64.5 in | Wt 217.0 lb

## 2015-05-19 DIAGNOSIS — J449 Chronic obstructive pulmonary disease, unspecified: Secondary | ICD-10-CM

## 2015-05-19 DIAGNOSIS — R0602 Shortness of breath: Secondary | ICD-10-CM

## 2015-05-19 DIAGNOSIS — F17210 Nicotine dependence, cigarettes, uncomplicated: Secondary | ICD-10-CM

## 2015-05-19 NOTE — Patient Instructions (Signed)
Continue using her inhalers as prescribed  Return to clinic in 6 months 

## 2015-05-19 NOTE — Progress Notes (Signed)
Subjective:    Patient ID: Amber Scott, female    DOB: 1955/10/17, 60 y.o.   MRN: [id number]  PROBLEM LIST Moderate COPD Active smoker  HPI  Follow-up for COPD. Mrs. Rigdon is a 60 year old patient with heavy smoking history, COPD. She is a former patient of Dr. Delford Field. She was evaluated in 2015 for recurrent pneumonia. A CT on 03/16/13 showed a lingular opacity. She underwent bronchoscopy for this. There is no evidence of malignancy and the cultures grew hemolytic streptococci. She was treated with antibiotics with improvement in symptoms. She is on Xolair per dr. Ardyth Harps for recurrent utricaria.  She was hospitalized last month for ankle fracture s/p ORIF. She also had a cardiology eval earlier this year with negative stress test and echo.   DATA: Spirometry (03/27/13) FVC 3.69 (115%) FEV1 1.96 (76%) F/F 53  Spirometry (12/24/14) FVC 3.20 (97%) FEV1 2.05 (78%) F/F 64 Moderate obstruction.  Echo 02/11/15 LVEF 60-65%, Mild LVH  Stress test 02/18/15 1. No evidence for ischemia or infarction.  2. Normal EF.  3. Normal study.   Screening CT chest 05/15/15 1. No acute cardiopulmonary abnormalities. No suspicious pulmonary nodule or mass identified. 2. Emphysema. 3. Aortic atherosclerosis and LAD coronary artery calcification  Social history: She has smoked 2 packs per day at least for 37 years. She is trying to cut and is down to 5 per day. 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 There are no obvious exposures at work or at home.  Family History: Non contributory  Patient Active Problem List   Diagnosis Date Noted  . Dyspnea 12/24/2014  . Acute bronchitis 04/02/2014  . Allergic rhinitis 08/20/2013  . Cervical radiculitis 08/20/2013  . Acid reflux 08/20/2013  . Cervical pain 08/20/2013  . Vaginal vault smear abnormal 08/20/2013  . Obstructive chronic bronchitis without exacerbation (HCC) 08/20/2013  . Hives 08/20/2013  . At high risk for falls  05/11/2013  . Tobacco use disorder 03/27/2013  . Hypertension   . Anxiety 11/03/2010    Current outpatient prescriptions:  .  acetaminophen (TYLENOL) 325 MG tablet, Take 650 mg by mouth., Disp: , Rfl:  .  albuterol (PROVENTIL) (2.5 MG/3ML) 0.083% nebulizer solution, Take 2.5 mg by nebulization every 6 (six) hours as needed for wheezing or shortness of breath., Disp: , Rfl:  .  amLODipine (NORVASC) 10 MG tablet, Take 10 mg by mouth daily., Disp: , Rfl: 3 .  aspirin 81 MG tablet, Take 81 mg by mouth daily., Disp: , Rfl:  .  cetirizine (ZYRTEC) 10 MG tablet, Take 1 tablet (10 mg total) by mouth 2 (two) times daily., Disp: 180 tablet, Rfl: 0 .  diphenhydrAMINE (BENADRYL) 2 % cream, Apply 1 application topically as directed. , Disp: , Rfl:  .  diphenhydrAMINE (BENADRYL) 25 MG tablet, Take 25 mg by mouth every 8 (eight) hours as needed for itching, allergies or sleep. As needed, Disp: , Rfl:  .  Docusate Calcium (STOOL SOFTENER PO), Take 2 capsules by mouth daily., Disp: , Rfl:  .  ENSURE (ENSURE), Take 237 mLs by mouth 2 (two) times daily., Disp: , Rfl:  .  EPINEPHrine (EPIPEN 2-PAK) 0.3 mg/0.3 mL IJ SOAJ injection, USE AS DIRECTED FOR LIFE-THREATENING ALLERGIC REACTIONS, Disp: 2 Device, Rfl: 1 .  Fluticasone Furoate-Vilanterol (BREO ELLIPTA) 200-25 MCG/INH AEPB, Inhale 1 puff into the lungs daily., Disp: 1 each, Rfl: 5 .  montelukast (SINGULAIR) 10 MG tablet, Take 10 mg by mouth every evening., Disp: , Rfl:  5 .  nicotine (NICODERM CQ - DOSED IN MG/24 HOURS) 21 mg/24hr patch, Place 21 mg onto the skin daily., Disp: , Rfl:  .  omalizumab (XOLAIR) 150 MG injection, Inject into the skin every 28 (twenty-eight) days., Disp: , Rfl:  .  omeprazole (PRILOSEC) 20 MG capsule, Take 20 mg by mouth 2 (two) times daily., Disp: , Rfl:  .  ranitidine (ZANTAC) 150 MG tablet, Take 150 mg by mouth 2 (two) times daily. , Disp: , Rfl: 5 .  sertraline (ZOLOFT) 50 MG tablet, Take 50 mg by mouth daily., Disp: , Rfl: 1 .   triamcinolone cream (KENALOG) 0.1 %, Apply 1 application topically 2 (two) times daily., Disp: , Rfl: 0 .  Umeclidinium Bromide 62.5 MCG/INH AEPB, Inhale 1 puff into the lungs daily., Disp: , Rfl:   Current facility-administered medications:  .  omalizumab Geoffry Paradise(XOLAIR) injection 300 mg, 300 mg, Subcutaneous, Q28 days, Jessica PriestEric J Kozlow, MD, 300 mg at 05/14/15 1416  Review of Systems  Complains of dry nonproductive cough, dyspnea on exertion No sputum production, wheezing, hemoptysis. No fevers, chills, malaise, fatigue, weight loss. No chest pain, palpitations. No nausea, vomiting, diarrhea. All other review of systems are negative.     Objective:   Physical Exam  Blood pressure 138/78, pulse 64, height 5' 4.5" (1.638 m), weight 217 lb (98.431 kg), SpO2 95 %. General: No distress, obese Neuro: No focal deficits HEENT: PERRLA, Moist mucus membranes Cardiovascular: RRR, S1 S2 Lungs: Clear to auscultation. No wheeze or crackles Abdomen: Soft, BS+ Skin: No rash/bruising.    Assessment & Plan:  #1 Moderate COPD. She is stable on Breo elipta and Incruse. She has an exacerbation last year but is doing better this year so far. She was started on O2 after her last hospitalization for ankle fracture. She will continue the same for now. Recheck sats at next visit.  #2 Smoking. Smoking cessation encouraged. Lung cancer screening CT shows RML density> likely benign findings. Repeat CT is OK.  #3 Allergies, utricaria. On Xolair.  Chilton GreathousePraveen Jonathandavid Marlett MD [facility] Pager [phone] If no answer or after 3pm call: [phone] 05/19/2015, 11:15 AM

## 2015-05-23 ENCOUNTER — Telehealth: Payer: Self-pay | Admitting: Acute Care

## 2015-05-23 NOTE — Telephone Encounter (Signed)
I called to give Amber Scott her low-dose CT screening results. I explained that the screening results indicated no suspicious pulmonary nodules or masses. Because the scan was not read and lung RADS format, I explained to her that I would let her know if follow-up screening was recommended in 12 months. I have requested clarification from the radiologist who read the scan regarding follow-up. Amber Scott verbalized understanding of the above and had no further questions. She has my contact information in the event she has questions in the future.

## 2015-05-30 ENCOUNTER — Telehealth: Payer: Self-pay | Admitting: Acute Care

## 2015-05-30 DIAGNOSIS — F17210 Nicotine dependence, cigarettes, uncomplicated: Principal | ICD-10-CM

## 2015-05-30 NOTE — Telephone Encounter (Signed)
I called Amber Scott as I had promised her I would do once her low-dose CT scan was read and lung RADS format. Dr. Bradly ChrisStroud had written an addendum report of his earlier reading of the scan once he read it in the lung RADS format to allow for comparison. I explained to Amber Scott that the follow-up reading was read as a 4B, indicating follow-up diagnostics. I explained that I had reviewed the patient's scans  with Dr. Levy Pupaobert Byrum, looking back several years. Dr. Delton CoombesByrum felt the areas of concern or more likely scar tissue. However to be on the safe side I explained that we will re-scan her in 3 months to reassess. She verbalized understanding of the above and had no further questions. She has my contact information in the event that she has questions in the future.

## 2015-06-11 ENCOUNTER — Ambulatory Visit (INDEPENDENT_AMBULATORY_CARE_PROVIDER_SITE_OTHER): Payer: Self-pay

## 2015-06-11 DIAGNOSIS — J4550 Severe persistent asthma, uncomplicated: Secondary | ICD-10-CM

## 2015-06-11 DIAGNOSIS — L501 Idiopathic urticaria: Secondary | ICD-10-CM

## 2015-07-22 ENCOUNTER — Ambulatory Visit (INDEPENDENT_AMBULATORY_CARE_PROVIDER_SITE_OTHER): Payer: Self-pay

## 2015-07-22 DIAGNOSIS — L500 Allergic urticaria: Secondary | ICD-10-CM

## 2015-08-18 ENCOUNTER — Ambulatory Visit (HOSPITAL_BASED_OUTPATIENT_CLINIC_OR_DEPARTMENT_OTHER)
Admission: RE | Admit: 2015-08-18 | Discharge: 2015-08-18 | Disposition: A | Discharge status: Home / Self Care | Payer: Self-pay | Source: Ambulatory Visit | Attending: Acute Care | Admitting: Acute Care

## 2015-08-18 DIAGNOSIS — I2510 Atherosclerotic heart disease of native coronary artery without angina pectoris: Secondary | ICD-10-CM | POA: Insufficient documentation

## 2015-08-18 DIAGNOSIS — F17210 Nicotine dependence, cigarettes, uncomplicated: Secondary | ICD-10-CM | POA: Insufficient documentation

## 2015-08-18 DIAGNOSIS — I7789 Other specified disorders of arteries and arterioles: Secondary | ICD-10-CM | POA: Insufficient documentation

## 2015-08-18 DIAGNOSIS — I700 Atherosclerosis of aorta: Secondary | ICD-10-CM | POA: Insufficient documentation

## 2015-08-18 DIAGNOSIS — Z122 Encounter for screening for malignant neoplasm of respiratory organs: Secondary | ICD-10-CM | POA: Insufficient documentation

## 2015-08-19 ENCOUNTER — Ambulatory Visit (INDEPENDENT_AMBULATORY_CARE_PROVIDER_SITE_OTHER): Payer: Self-pay

## 2015-08-19 DIAGNOSIS — L500 Allergic urticaria: Secondary | ICD-10-CM

## 2015-08-19 MED FILL — CYANOCOBALAMIN 1,000 MCG/ML: 1000 | 28 days supply | Qty: 4 | Fill #0

## 2015-08-21 ENCOUNTER — Telehealth: Payer: Self-pay | Admitting: Acute Care

## 2015-08-21 ENCOUNTER — Other Ambulatory Visit: Payer: Self-pay | Admitting: Acute Care

## 2015-08-21 DIAGNOSIS — F17210 Nicotine dependence, cigarettes, uncomplicated: Principal | ICD-10-CM

## 2015-08-21 NOTE — Telephone Encounter (Signed)
The results of the low dose CT scan for lung cancer screening were called to the patient. I explained that her scan was read as a Lung RADS 2: nodules that are benign in appearance and behavior with a very low likelihood of becoming a clinically active cancer due to size or lack of growth. Recommendation per radiology is for a repeat LDCT in 12 months. I explained that we will schedule her scan for 08/2016. She verbalized understanding and had no further questions.

## 2015-09-16 ENCOUNTER — Ambulatory Visit (INDEPENDENT_AMBULATORY_CARE_PROVIDER_SITE_OTHER): Payer: Self-pay

## 2015-09-16 DIAGNOSIS — L500 Allergic urticaria: Secondary | ICD-10-CM

## 2015-09-24 ENCOUNTER — Ambulatory Visit: Payer: Self-pay | Admitting: Allergy and Immunology

## 2015-10-08 ENCOUNTER — Ambulatory Visit (INDEPENDENT_AMBULATORY_CARE_PROVIDER_SITE_OTHER): Payer: Self-pay | Admitting: Allergy and Immunology

## 2015-10-08 ENCOUNTER — Encounter: Payer: Self-pay | Admitting: Allergy and Immunology

## 2015-10-08 ENCOUNTER — Encounter (INDEPENDENT_AMBULATORY_CARE_PROVIDER_SITE_OTHER): Payer: Self-pay

## 2015-10-08 VITALS — BP 110/75 | HR 78 | Temp 97.8°F | Resp 16

## 2015-10-08 DIAGNOSIS — L509 Urticaria, unspecified: Secondary | ICD-10-CM

## 2015-10-08 DIAGNOSIS — Z720 Tobacco use: Secondary | ICD-10-CM

## 2015-10-08 DIAGNOSIS — J029 Acute pharyngitis, unspecified: Secondary | ICD-10-CM

## 2015-10-08 MED ORDER — AMOXICILLIN-POT CLAVULANATE 875-125 MG PO TABS: 1.0000 | ORAL_TABLET | Freq: Two times a day (BID) | ORAL | 0 refills | Status: DC

## 2015-10-08 NOTE — Progress Notes (Signed)
Follow-up Note  Referring Provider: Bernerd Limbo, MD Primary Provider: COUSINS, Caleen Jobs, FNP Date of Office Visit: 10/08/2015  Subjective:   Amber Scott (DOB: 10/28/1955) is a 60 y.o. female [facility] in re-evaluation of the following:  HPI: Theora returns to this clinic in evaluation of her chronic urticaria treated successively with Xolair. I've not seen her in his clinic since March 2017.  While continuing to use Xolair she has not had redevelopment of her urticaria. She does get itchy if she comes off her H1 and H2 receptor blocker but otherwise has done very well.  Shataya has developed an issue with a very bad sore throat over the course the past 6 days that is not improving. She has no other associated systemic or constitutional symptoms.  She still continues to smoke extensively and occasionally uses a bronchodilator.  She is having a tremendous amount of problem with her left hip and has had very thorough evaluation including multiple imaging studies and is going to meet with her orthopedic surgeon later today.    Medication List      acetaminophen 325 MG tablet Commonly known as:  TYLENOL Take 650 mg by mouth.   albuterol (2.5 MG/3ML) 0.083% nebulizer solution Commonly known as:  PROVENTIL Take 2.5 mg by nebulization every 6 (six) hours as needed for wheezing or shortness of breath.   amLODipine 10 MG tablet Commonly known as:  NORVASC Take 10 mg by mouth daily.   aspirin 81 MG tablet Take 81 mg by mouth daily.   atorvastatin 20 MG tablet Commonly known as:  LIPITOR Take 20 mg by mouth.   celecoxib 100 MG capsule Commonly known as:  CELEBREX Take 100 mg by mouth.   cetirizine 10 MG tablet Commonly known as:  ZYRTEC Take 1 tablet (10 mg total) by mouth 2 (two) times daily.   cyanocobalamin 1000 MCG/ML injection Commonly known as:  (VITAMIN B-12) Inject into the muscle.   diphenhydrAMINE 2  % cream Commonly known as:  BENADRYL Apply 1 application topically as directed.   diphenhydrAMINE 25 MG tablet Commonly known as:  BENADRYL Take 25 mg by mouth every 8 (eight) hours as needed for itching, allergies or sleep. As needed   ENSURE Take 237 mLs by mouth 2 (two) times daily.   EPINEPHrine 0.3 mg/0.3 mL Soaj injection Commonly known as:  EPIPEN 2-PAK USE AS DIRECTED FOR LIFE-THREATENING ALLERGIC REACTIONS   montelukast 10 MG tablet Commonly known as:  SINGULAIR Take 10 mg by mouth every evening.   nicotine 21 mg/24hr patch Commonly known as:  NICODERM CQ - dosed in mg/24 hours Place 21 mg onto the skin daily.   omalizumab 150 MG injection Commonly known as:  XOLAIR Inject 300 mg into the skin.   omeprazole 20 MG capsule Commonly known as:  PRILOSEC Take 20 mg by mouth.   ranitidine 150 MG tablet Commonly known as:  ZANTAC Take 150 mg by mouth 2 (two) times daily.   sertraline 50 MG tablet Commonly known as:  ZOLOFT Take 50 mg by mouth daily.   STOOL SOFTENER PO Take 2 capsules by mouth daily.   traMADol 50 MG tablet Commonly known as:  ULTRAM Take 50 mg by mouth.   triamcinolone cream 0.1 % Commonly known as:  KENALOG Apply 1 application topically 2 (two) times daily.   umeclidinium bromide 62.5 MCG/INH Aepb Commonly known as:  INCRUSE ELLIPTA Inhale 1 puff into the  lungs daily.       Past Medical History:  Diagnosis Date  . Hypertension     Past Surgical History:  Procedure Laterality Date  . CHOLECYSTECTOMY    . VESICOVAGINAL FISTULA CLOSURE W/ TAH    . VIDEO BRONCHOSCOPY Bilateral 04/10/2013   Procedure: VIDEO BRONCHOSCOPY WITH FLUORO;  Surgeon: Storm FriskPatrick E Wright, MD;  Location: [facility];  Service: Cardiopulmonary;  Laterality: Bilateral;    No Known Allergies  Review of systems negative except as noted in HPI / PMHx or noted below:  Review of Systems  Constitutional: Negative.   HENT: Negative.   Eyes: Negative.     Respiratory: Negative.   Cardiovascular: Negative.   Gastrointestinal: Negative.   Genitourinary: Negative.   Musculoskeletal: Negative.   Skin: Negative.   Neurological: Negative.   Endo/Heme/Allergies: Negative.   Psychiatric/Behavioral: Negative.      Objective:   Vitals:   10/08/15 1112  BP: 110/75  Pulse: 78  Resp: 16  Temp: 97.8 F (36.6 C)          Physical Exam  Constitutional: She is well-developed, well-nourished, and in no distress.  Walker  HENT:  Head: Normocephalic.  Right Ear: Tympanic membrane, external ear and ear canal normal.  Left Ear: Tympanic membrane, external ear and ear canal normal.  Nose: Nose normal. No mucosal edema or rhinorrhea.  Mouth/Throat: Uvula is midline and mucous membranes are normal. Posterior oropharyngeal erythema present. No oropharyngeal exudate.  Eyes: Conjunctivae are normal.  Neck: Trachea normal. No tracheal tenderness present. No tracheal deviation present. No thyromegaly present.  Cardiovascular: Normal rate, regular rhythm, S1 normal, S2 normal and normal heart sounds.   No murmur heard. Pulmonary/Chest: Breath sounds normal. No stridor. No respiratory distress. She has no wheezes. She has no rales.  Musculoskeletal: She exhibits no edema.  Lymphadenopathy:       Head (right side): No tonsillar adenopathy present.       Head (left side): No tonsillar adenopathy present.    She has no cervical adenopathy.  Neurological: She is alert.  Skin: No rash noted. She is not diaphoretic. No erythema. Nails show no clubbing.  Psychiatric: Mood and affect normal.    Diagnostics:    Spirometry was performed and demonstrated an FEV1 of 1.99 at 81 % of predicted.   Assessment and Plan:   1. Urticaria   2. Pharyngitis   3. Tobacco use     1. Continue Xolair and EpiPen  2. Continue cetirizine 10 mg twice a day and ranitidine 150 mg twice a day  3. Augmentin 875 one tablet twice a day for 10 days  4. Return to  clinic in 6 months  5. Obtain flu vaccine  [name] appears to be doing great regarding her chronic urticaria while using Xolair and an H1 any H2 receptor blocker and we'll keep her on these medications at this point. She appears have rather significant pharyngitis that is not improving on its own and I will now give her a broad-spectrum antibiotic to cover bacterial pharyngitis. I once again had a talk with her today about the need to stop smoking as this is obviously causing some degree of inflammation of her respiratory tract. I'll see her back in this clinic in 6 months or earlier if there is a problem  Laurette SchimkeEric Kozlow, MD [facility]

## 2015-10-08 NOTE — Patient Instructions (Signed)
   1. Continue Xolair and EpiPen  2. Continue cetirizine 10 mg twice a day and ranitidine 150 mg twice a day  3. Augmentin 875 one tablet twice a day for 10 days  4. Return to clinic in 6 months  5. Obtain flu vaccine

## 2015-10-14 ENCOUNTER — Ambulatory Visit: Payer: Self-pay

## 2015-10-14 ENCOUNTER — Telehealth: Payer: Self-pay | Admitting: Allergy and Immunology

## 2015-10-14 NOTE — Telephone Encounter (Signed)
Amber Scott,  [facility] called about this patient, Amber Scott 11/05/1955. York SpanielSaid they talked to you. He gave me a patient ID# [id number]. Patient case going into rotation. Last saw Dr. Lucie LeatherKozlow 10/08/15. [phone]

## 2015-10-15 MED FILL — CYANOCOBALAMIN 1,000 MCG/ML: 1000 | 28 days supply | Qty: 4 | Fill #1

## 2015-11-25 ENCOUNTER — Other Ambulatory Visit: Payer: Self-pay | Admitting: *Deleted

## 2015-11-25 ENCOUNTER — Ambulatory Visit: Payer: Self-pay

## 2015-11-25 ENCOUNTER — Ambulatory Visit (INDEPENDENT_AMBULATORY_CARE_PROVIDER_SITE_OTHER): Payer: Self-pay

## 2015-11-25 DIAGNOSIS — L500 Allergic urticaria: Secondary | ICD-10-CM

## 2015-11-25 DIAGNOSIS — L509 Urticaria, unspecified: Secondary | ICD-10-CM

## 2015-11-25 MED ORDER — MONTELUKAST SODIUM 10 MG PO TABS: 10.0000 mg | ORAL_TABLET | Freq: Every evening | ORAL | 2 refills | Status: DC

## 2015-12-23 ENCOUNTER — Ambulatory Visit (INDEPENDENT_AMBULATORY_CARE_PROVIDER_SITE_OTHER): Payer: Self-pay

## 2015-12-23 ENCOUNTER — Ambulatory Visit: Payer: Self-pay

## 2015-12-23 DIAGNOSIS — L509 Urticaria, unspecified: Secondary | ICD-10-CM

## 2016-01-13 ENCOUNTER — Other Ambulatory Visit: Payer: Self-pay | Admitting: Allergy and Immunology

## 2016-01-20 ENCOUNTER — Ambulatory Visit: Payer: Self-pay

## 2016-03-01 ENCOUNTER — Ambulatory Visit (INDEPENDENT_AMBULATORY_CARE_PROVIDER_SITE_OTHER): Payer: Self-pay

## 2016-03-01 ENCOUNTER — Ambulatory Visit: Payer: Self-pay

## 2016-03-01 DIAGNOSIS — L500 Allergic urticaria: Secondary | ICD-10-CM

## 2016-03-23 ENCOUNTER — Ambulatory Visit (INDEPENDENT_AMBULATORY_CARE_PROVIDER_SITE_OTHER): Payer: No Typology Code available for payment source | Admitting: Allergy and Immunology

## 2016-03-23 ENCOUNTER — Encounter: Payer: Self-pay | Admitting: Allergy and Immunology

## 2016-03-23 VITALS — BP 128/80 | HR 80 | Resp 18

## 2016-03-23 DIAGNOSIS — J4540 Moderate persistent asthma, uncomplicated: Secondary | ICD-10-CM

## 2016-03-23 DIAGNOSIS — L500 Allergic urticaria: Secondary | ICD-10-CM

## 2016-03-23 DIAGNOSIS — H1010 Acute atopic conjunctivitis, unspecified eye: Secondary | ICD-10-CM

## 2016-03-23 DIAGNOSIS — Z720 Tobacco use: Secondary | ICD-10-CM

## 2016-03-23 DIAGNOSIS — H1045 Other chronic allergic conjunctivitis: Secondary | ICD-10-CM

## 2016-03-23 MED ORDER — BEPOTASTINE BESILATE 1.5 % OP SOLN: OPHTHALMIC | 5 refills | Status: DC

## 2016-03-23 MED ORDER — HYDROXYZINE HCL 25 MG PO TABS: ORAL_TABLET | ORAL | 3 refills | Status: DC

## 2016-03-23 NOTE — Progress Notes (Signed)
Follow-up Note  Referring Provider: Amelia Jo, FNP Primary Provider: COUSINS, MELISSA Mervyn Skeeters, FNP Date of Office Visit: 03/23/2016  Subjective:   Amber Scott (DOB: June 22, 1955) is a 61 y.o. female who [facility] in re-evaluation of the following:  HPI: Amber Scott returns to this clinic in reevaluation of her chronic urticaria, asthma, and new onset problems with her eyes. I've not seen her in his clinic since September 2017.  She continues to receive omalizumab injection for her urticaria which has resulting in excellent control of this condition as long as she keeps on schedule and she continues to use a combination of cetirizine twice a day and montelukast once a day and occasionally Benadryl and Vistaril at night. She has not had any additional systemic or constitutional symptoms developed while utilizing this plan. She did miss an injection of omalizumab in January and within 2 weeks of that scheduled administration date she started to develop urticaria which resolved with her subsequent injection.  She continues to use anti-inflammatory agents for her respiratory tract prescribed by her pulmonologist for her asthma. She continues to smoke tobacco extensively.  She is receiving a steroid injection in her hip every 3 months and is using Celebrex for a hip problem.  Her eyes a been very itchy over the course of the past month or so. She actually feels like they're a little bit swollen. There has not been any significant environmental change giving rise to this issue. She has not had any other associated atopic symptoms.  She did not receive the flu vaccine this year.  Allergies as of 03/23/2016   No Known Allergies     Medication List      acetaminophen 325 MG tablet Commonly known as:  TYLENOL Take 650 mg by mouth.   albuterol (2.5 MG/3ML) 0.083% nebulizer solution Commonly known as:  PROVENTIL Take 2.5 mg by nebulization  every 6 (six) hours as needed for wheezing or shortness of breath.   amLODipine 10 MG tablet Commonly known as:  NORVASC Take 10 mg by mouth daily.   aspirin 81 MG tablet Take 81 mg by mouth daily.   atorvastatin 20 MG tablet Commonly known as:  LIPITOR Take 20 mg by mouth.   celecoxib 200 MG capsule Commonly known as:  CELEBREX Take 200 mg by mouth.   cetirizine 10 MG tablet Commonly known as:  ZYRTEC Take 1 tablet (10 mg total) by mouth 2 (two) times daily.   cyanocobalamin 1000 MCG/ML injection Commonly known as:  (VITAMIN B-12) Inject into the muscle.   cyclobenzaprine 5 MG tablet Commonly known as:  FLEXERIL TAKE 1 Tablet BY MOUTH 3 TIMES DAILY   diphenhydrAMINE 2 % cream Commonly known as:  BENADRYL Apply 1 application topically as directed.   diphenhydrAMINE 25 MG tablet Commonly known as:  BENADRYL Take 25 mg by mouth every 8 (eight) hours as needed for itching, allergies or sleep. As needed   ENSURE Take 237 mLs by mouth 2 (two) times daily.   EPINEPHrine 0.3 mg/0.3 mL Soaj injection Commonly known as:  EPIPEN 2-PAK USE AS DIRECTED FOR LIFE-THREATENING ALLERGIC REACTIONS   montelukast 10 MG tablet Commonly known as:  SINGULAIR Take one tablet once daily   nicotine 21 mg/24hr patch Commonly known as:  NICODERM CQ - dosed in mg/24 hours Place 21 mg onto the skin daily.   omalizumab 150 MG injection Commonly known as:  XOLAIR Inject 300 mg into the skin.  omeprazole 20 MG capsule Commonly known as:  PRILOSEC Take 20 mg by mouth.   ranitidine 150 MG tablet Commonly known as:  ZANTAC Take 150 mg by mouth 2 (two) times daily.   sertraline 50 MG tablet Commonly known as:  ZOLOFT Take 50 mg by mouth daily.   STOOL SOFTENER PO Take 2 capsules by mouth daily.   traMADol 50 MG tablet Commonly known as:  ULTRAM Take 50 mg by mouth.   triamcinolone cream 0.1 % Commonly known as:  KENALOG Apply 1 application topically 2 (two) times daily.     umeclidinium bromide 62.5 MCG/INH Aepb Commonly known as:  INCRUSE ELLIPTA Inhale 1 puff into the lungs daily.       Past Medical History:  Diagnosis Date  . Hypertension   . Urticaria     Past Surgical History:  Procedure Laterality Date  . CHOLECYSTECTOMY    . VESICOVAGINAL FISTULA CLOSURE W/ TAH    . VIDEO BRONCHOSCOPY Bilateral 04/10/2013   Procedure: VIDEO BRONCHOSCOPY WITH FLUORO;  Surgeon: Amber Frisk, MD;  Location: [facility];  Service: Cardiopulmonary;  Laterality: Bilateral;    Review of systems negative except as noted in HPI / PMHx or noted below:  Review of Systems  Constitutional: Negative.   HENT: Negative.   Eyes: Negative.   Respiratory: Negative.   Cardiovascular: Negative.   Gastrointestinal: Negative.   Genitourinary: Negative.   Musculoskeletal: Negative.   Skin: Negative.   Neurological: Negative.   Endo/Heme/Allergies: Negative.   Psychiatric/Behavioral: Negative.      Objective:   Vitals:   03/23/16 1510  BP: 128/80  Pulse: 80  Resp: 18          Physical Exam  Constitutional: She is well-developed, well-nourished, and in no distress.  Strong smell of tobacco smoke  HENT:  Head: Normocephalic.  Right Ear: Tympanic membrane, external ear and ear canal normal.  Left Ear: Tympanic membrane, external ear and ear canal normal.  Nose: Nose normal. No mucosal edema or rhinorrhea.  Mouth/Throat: Uvula is midline, oropharynx is clear and moist and mucous membranes are normal. No oropharyngeal exudate.  Eyes: Right conjunctiva is injected. Left conjunctiva is injected.  Neck: Trachea normal. No tracheal tenderness present. No tracheal deviation present. No thyromegaly present.  Cardiovascular: Normal rate, regular rhythm, S1 normal, S2 normal and normal heart sounds.   No murmur heard. Pulmonary/Chest: Breath sounds normal. No stridor. No respiratory distress. She has no wheezes. She has no rales.  Musculoskeletal: She exhibits no  edema.  Lymphadenopathy:       Head (right side): No tonsillar adenopathy present.       Head (left side): No tonsillar adenopathy present.    She has no cervical adenopathy.  Neurological: She is alert. Gait normal.  Skin: No rash noted. She is not diaphoretic. No erythema. Nails show no clubbing.  Psychiatric: Mood and affect normal.    Diagnostics:    Spirometry was performed and demonstrated an FEV1 of 2.13 at 84 % of predicted.    Assessment and Plan:   1. Allergic urticaria   2. Seasonal allergic conjunctivitis   3. Moderate persistent asthma, uncomplicated   4. Tobacco use     1. Continue Xolair and EpiPen  2. Continue cetirizine 10 mg twice a day and ranitidine 150 mg twice a day and montelukast 10mg  one time per day  3. Continue Breo and Incruse and Albuterol as previously prescibed  4. Can add OTC Benadryl or hydroxyzine (Vistaril) if needed  5. Can add Bepreve one drop each eye twice a day if needed.  6. Return to clinic in 6 months or earlier if problem   [name] appears to be doing well from the standpoint of her urticarial control while using her omalizumab and her other medications. Her airway appears to be doing relatively well while consistently using her anti-inflammatory medications for her respiratory tract prescriber pulmonologist. Her eyes are inflamed and I given her samples of Bepreve eyedrops to see if this helps. If she continues to have problems with her eyes she should go see an ophthalmologist to see if she has developed dry eye. I once again had a talk with her today about her tobacco hobby and how she needs to find a replacement. I will see her back in this clinic in 6 months or earlier if there is a problem.  Laurette SchimkeEric Kozlow, MD Allergy / Immunology [facility]

## 2016-03-23 NOTE — Patient Instructions (Addendum)
   1. Continue Xolair and EpiPen  2. Continue cetirizine 10 mg twice a day and ranitidine 150 mg twice a day and montelukast 10mg  one time per day  3. Continue Breo and Incruse and Albuterol as previously prescibed  4. Can add OTC Benadryl or hydroxyzine (Vistaril) if needed  5. Can add Bepreve one drop each eye twice a day if needed.  6. Return to clinic in 6 months or earlier if problem

## 2016-03-29 ENCOUNTER — Ambulatory Visit (INDEPENDENT_AMBULATORY_CARE_PROVIDER_SITE_OTHER): Payer: Self-pay

## 2016-03-29 DIAGNOSIS — L500 Allergic urticaria: Secondary | ICD-10-CM

## 2016-03-31 ENCOUNTER — Ambulatory Visit (INDEPENDENT_AMBULATORY_CARE_PROVIDER_SITE_OTHER): Payer: Self-pay | Admitting: Pulmonary Disease

## 2016-03-31 ENCOUNTER — Encounter: Payer: Self-pay | Admitting: Pulmonary Disease

## 2016-03-31 VITALS — BP 130/78 | HR 69 | Ht 64.5 in | Wt 216.0 lb

## 2016-03-31 DIAGNOSIS — J449 Chronic obstructive pulmonary disease, unspecified: Secondary | ICD-10-CM

## 2016-03-31 DIAGNOSIS — F17210 Nicotine dependence, cigarettes, uncomplicated: Secondary | ICD-10-CM

## 2016-03-31 MED ORDER — UMECLIDINIUM BROMIDE 62.5 MCG/INH IN AEPB: 1.0000 | INHALATION_SPRAY | Freq: Every day | RESPIRATORY_TRACT | 0 refills | Status: AC

## 2016-03-31 MED ORDER — BUDESONIDE-FORMOTEROL FUMARATE 80-4.5 MCG/ACT IN AERO: 2.0000 | INHALATION_SPRAY | Freq: Two times a day (BID) | RESPIRATORY_TRACT | 0 refills | Status: DC

## 2016-03-31 MED ORDER — FLUTICASONE FUROATE-VILANTEROL 200-25 MCG/INH IN AEPB: 1.0000 | INHALATION_SPRAY | Freq: Every day | RESPIRATORY_TRACT | 0 refills | Status: DC

## 2016-03-31 NOTE — Progress Notes (Signed)
Amber Scott    [id number]    February 19, 1955  Primary Care Physician:COUSINS, MELISSA A, FNP  Referring Physician: Amelia Jo, FNP [address] [place], [place] [address]  Chief complaint:   COPD GOLD C (CAT Score 15, 2 exacerbations over past year) Active smoker  HPI: Amber Scott is a 61 year old patient with heavy smoking history, COPD. She is a former patient of Dr. Delford Field. She was evaluated in 2015 for recurrent pneumonia. A CT on 03/16/13 showed a lingular opacity. She underwent bronchoscopy for this. There is no evidence of malignancy and the cultures grew hemolytic streptococci. She was treated with antibiotics with improvement in symptoms. She is on Xolair per dr. Lucie Leather for recurrent utricaria.  She was hospitalized last month for ankle fracture s/p ORIF. She also had a cardiology eval earlier on 2017 with negative stress test and echo.   Interim history: She continues on the breo and incruse which are helping with her symptoms. She is also on Xolair for allergic utricaria. She has complains of red itchy eyes, dry nose for the past few weeks. There is no worsening of her resp symptoms.   Outpatient Encounter Prescriptions as of 03/31/2016  Medication Sig  . acetaminophen (TYLENOL) 325 MG tablet Take 650 mg by mouth.  Marland Kitchen albuterol (PROVENTIL) (2.5 MG/3ML) 0.083% nebulizer solution Take 2.5 mg by nebulization every 6 (six) hours as needed for wheezing or shortness of breath.  Marland Kitchen amLODipine (NORVASC) 10 MG tablet Take 10 mg by mouth daily.  Marland Kitchen aspirin 81 MG tablet Take 81 mg by mouth daily.  Marland Kitchen atorvastatin (LIPITOR) 20 MG tablet Take 20 mg by mouth.  . Bepotastine Besilate (BEPREVE) 1.5 % SOLN Use one drop in each eye twice daily if needed  . celecoxib (CELEBREX) 200 MG capsule Take 200 mg by mouth.  . cetirizine (ZYRTEC) 10 MG tablet Take 1 tablet (10 mg total) by mouth 2 (two) times daily.  . cyanocobalamin (,VITAMIN B-12,) 1000 MCG/ML injection Inject into the  muscle.  Tery Sanfilippo Calcium (STOOL SOFTENER PO) Take 2 capsules by mouth daily.  Marland Kitchen ENSURE (ENSURE) Take 237 mLs by mouth 2 (two) times daily.  Marland Kitchen EPINEPHrine (EPIPEN 2-PAK) 0.3 mg/0.3 mL IJ SOAJ injection USE AS DIRECTED FOR LIFE-THREATENING ALLERGIC REACTIONS  . fluticasone furoate-vilanterol (BREO ELLIPTA) 200-25 MCG/INH AEPB Inhale 1 puff into the lungs daily.  . hydrOXYzine (ATARAX/VISTARIL) 25 MG tablet Take one tablet once or twice daily if needed  . montelukast (SINGULAIR) 10 MG tablet Take one tablet once daily  . omalizumab (XOLAIR) 150 MG injection Inject 300 mg into the skin.  Marland Kitchen omeprazole (PRILOSEC) 20 MG capsule Take 20 mg by mouth.  . ranitidine (ZANTAC) 150 MG tablet Take 150 mg by mouth 2 (two) times daily.   . sertraline (ZOLOFT) 50 MG tablet Take 50 mg by mouth daily.  . traMADol (ULTRAM) 50 MG tablet Take 50 mg by mouth.  . triamcinolone cream (KENALOG) 0.1 % Apply 1 application topically 2 (two) times daily.  Marland Kitchen Umeclidinium Bromide 62.5 MCG/INH AEPB Inhale 1 puff into the lungs daily.  . [DISCONTINUED] cyclobenzaprine (FLEXERIL) 5 MG tablet TAKE 1 Tablet BY MOUTH 3 TIMES DAILY  . [DISCONTINUED] diphenhydrAMINE (BENADRYL) 2 % cream Apply 1 application topically as directed.   . [DISCONTINUED] diphenhydrAMINE (BENADRYL) 25 MG tablet Take 25 mg by mouth every 8 (eight) hours as needed for itching, allergies or sleep. As needed  . [DISCONTINUED] nicotine (NICODERM CQ -  DOSED IN MG/24 HOURS) 21 mg/24hr patch Place 21 mg onto the skin daily.  . fluticasone furoate-vilanterol (BREO ELLIPTA) 200-25 MCG/INH AEPB Inhale 1 puff into the lungs daily.  Marland Kitchen umeclidinium bromide (INCRUSE ELLIPTA) 62.5 MCG/INH AEPB Inhale 1 puff into the lungs daily.   Facility-Administered Encounter Medications as of 03/31/2016  Medication  . omalizumab Geoffry Paradise) injection 300 mg    Allergies as of 03/31/2016  . (No Known Allergies)    Past Medical History:  Diagnosis Date  . Hypertension   .  Urticaria     Past Surgical History:  Procedure Laterality Date  . CHOLECYSTECTOMY    . VESICOVAGINAL FISTULA CLOSURE W/ TAH    . VIDEO BRONCHOSCOPY Bilateral 04/10/2013   Procedure: VIDEO BRONCHOSCOPY WITH FLUORO;  Surgeon: Storm Frisk, MD;  Location: [facility];  Service: Cardiopulmonary;  Laterality: Bilateral;    Family History  Problem Relation Age of Onset  . Lung cancer Mother   . Emphysema Maternal Uncle     unlce  . Lung cancer Maternal Uncle   . Allergic rhinitis Neg Hx   . Angioedema Neg Hx   . Asthma Neg Hx   . Atopy Neg Hx   . Eczema Neg Hx   . Immunodeficiency Neg Hx   . Urticaria Neg Hx     Social History   Social History  . Marital status: Divorced    Spouse name: N/A  . Number of children: N/A  . Years of education: N/A   Occupational History  . Retired    Social History Main Topics  . Smoking status: Current Some Day Smoker    Packs/day: 2.00    Years: 37.00    Types: Cigarettes    Start date: 01/11/1973  . Smokeless tobacco: Current User  . Alcohol use 0.0 oz/week     Comment: wine nightly   . Drug use: No  . Sexual activity: Not on file   Other Topics Concern  . Not on file   Social History Narrative  . No narrative on file    Review of systems: Review of Systems  Constitutional: Negative for fever and chills.  HENT: Negative.   Eyes: Negative for blurred vision.  Respiratory: as per HPI  Cardiovascular: Negative for chest pain and palpitations.  Gastrointestinal: Negative for vomiting, diarrhea, blood per rectum. Genitourinary: Negative for dysuria, urgency, frequency and hematuria.  Musculoskeletal: Negative for myalgias, back pain and joint pain.  Skin: Negative for itching and rash.  Neurological: Negative for dizziness, tremors, focal weakness, seizures and loss of consciousness.  Endo/Heme/Allergies: Negative for environmental allergies.  Psychiatric/Behavioral: Negative for depression, suicidal ideas and  hallucinations.  All other systems reviewed and are negative.  Physical Exam: Blood pressure 130/78, pulse 69, height 5' 4.5" (1.638 m), weight 216 lb (98 kg), SpO2 94 %. Gen:      No acute distress HEENT:  EOMI, sclera anicteric Neck:     No masses; no thyromegaly Lungs:    Clear to auscultation bilaterally; normal respiratory effort CV:         Regular rate and rhythm; no murmurs Abd:      + bowel sounds; soft, non-tender; no palpable masses, no distension Ext:    No edema; adequate peripheral perfusion Skin:      Warm and dry; no rash Neuro: alert and oriented x 3 Psych: normal mood and affect  Data Reviewed: Spirometry (03/27/13) FVC 3.69 (115%) FEV1 1.96 (76%) F/F 53  Spirometry (12/24/14) FVC 3.20 (97%) FEV1 2.05 (78%)  F/F 64 Moderate obstruction.  Echo 02/11/15 LVEF 60-65%, Mild LVH  Stress test 02/18/15 1. No evidence for ischemia or infarction.  2. Normal EF.  3. Normal study.   Screening CT chest 08/18/15  Emphysema, no lung nodules. Scarring in the right middle lobe and lingular.  Assessment:  #1 COPD GOLD C Stable on breo and incruse. She needs paper work for medicine assistance. Recheck sats at next visit.  #2 Smoking. Smoking cessation encouraged. Time spent counseling- 5mins Lung cancer screening CT shows benign findings. Follow up this year  #3 Allergies, utricaria. On xolair  Plan/Recommendations: - Continue screening CT chest - Continue breo, incruse. - Smoking cessation  Chilton GreathousePraveen Kambre Messner MD [facility] Pager [phone] 03/31/2016, 4:56 PM  CC: Amelia Joousins, Melissa A, FNP

## 2016-03-31 NOTE — Patient Instructions (Addendum)
Please continue the incruse and breo We will work on you patient assistance form Continue to work on smoking cessation.  Return in 6 months

## 2016-04-07 ENCOUNTER — Encounter (INDEPENDENT_AMBULATORY_CARE_PROVIDER_SITE_OTHER): Payer: Self-pay

## 2016-04-07 ENCOUNTER — Other Ambulatory Visit: Payer: Self-pay | Admitting: Allergy and Immunology

## 2016-04-07 ENCOUNTER — Ambulatory Visit (INDEPENDENT_AMBULATORY_CARE_PROVIDER_SITE_OTHER): Payer: Self-pay | Admitting: Cardiology

## 2016-04-07 ENCOUNTER — Encounter: Payer: Self-pay | Admitting: Cardiology

## 2016-04-07 VITALS — BP 118/66 | HR 74 | Ht 64.0 in | Wt 221.8 lb

## 2016-04-07 DIAGNOSIS — J449 Chronic obstructive pulmonary disease, unspecified: Secondary | ICD-10-CM

## 2016-04-07 DIAGNOSIS — I2584 Coronary atherosclerosis due to calcified coronary lesion: Secondary | ICD-10-CM

## 2016-04-07 DIAGNOSIS — R600 Localized edema: Secondary | ICD-10-CM

## 2016-04-07 DIAGNOSIS — I700 Atherosclerosis of aorta: Secondary | ICD-10-CM | POA: Insufficient documentation

## 2016-04-07 DIAGNOSIS — I2510 Atherosclerotic heart disease of native coronary artery without angina pectoris: Secondary | ICD-10-CM

## 2016-04-07 NOTE — Patient Instructions (Signed)
Medication Instructions:  The current medical regimen is effective;  continue present plan and medications.  Follow-Up: Follow up as needed with Dr Skains.  If you need a refill on your cardiac medications before your next appointment, please call your pharmacy.  Thank you for choosing [facility]!!     

## 2016-04-07 NOTE — Progress Notes (Signed)
Cardiology Office Note    Date:  04/07/2016   ID:  Amber Scott, DOB 02/28/1955, MRN [id number]  PCP:  COUSINS, MELISSA A, FNP  Cardiologist:   Donato SchultzMark Skains, MD     History of Present Illness:  Amber GivensCynthia C Scott is a 61 y.o. female with COPD, mild left carotid plaque, mild coronary calcification on CT, here for follow-up of dyspnea and concern about lower extremity edema.  She has been taking Celebrex as well as steroid shots and has noted some increased swelling in her lower extremities which actually have improved. She was worried about this. She also has canceled his upcoming surgery to have pins removed from her right ankle.  She denies any chest pain. No syncope, no orthopnea.  She's had prior allergy testing and states that this was negative. She wonders if some of the puffiness around her eyes could be from Celebrex for atorvastatin.  She states that she takes histamine blockers.    Past Medical History:  Diagnosis Date  . Hypertension   . Urticaria     Past Surgical History:  Procedure Laterality Date  . CHOLECYSTECTOMY    . VESICOVAGINAL FISTULA CLOSURE W/ TAH    . VIDEO BRONCHOSCOPY Bilateral 04/10/2013   Procedure: VIDEO BRONCHOSCOPY WITH FLUORO;  Surgeon: Storm FriskPatrick E Wright, MD;  Location: [facility];  Service: Cardiopulmonary;  Laterality: Bilateral;    Current Medications: Outpatient Medications Prior to Visit  Medication Sig Dispense Refill  . acetaminophen (TYLENOL) 325 MG tablet Take 650 mg by mouth.    Marland Kitchen. albuterol (PROVENTIL) (2.5 MG/3ML) 0.083% nebulizer solution Take 2.5 mg by nebulization every 6 (six) hours as needed for wheezing or shortness of breath.    Marland Kitchen. amLODipine (NORVASC) 10 MG tablet Take 10 mg by mouth daily.  3  . aspirin 81 MG tablet Take 81 mg by mouth daily.    Marland Kitchen. atorvastatin (LIPITOR) 20 MG tablet Take 20 mg by mouth.    . Bepotastine Besilate (BEPREVE) 1.5 % SOLN Use one drop in each eye twice daily if needed 10 mL 5  . celecoxib  (CELEBREX) 200 MG capsule Take 200 mg by mouth.    . cetirizine (ZYRTEC) 10 MG tablet Take 1 tablet (10 mg total) by mouth 2 (two) times daily. 180 tablet 0  . cyanocobalamin (,VITAMIN B-12,) 1000 MCG/ML injection Inject into the muscle.    Tery Sanfilippo. Docusate Calcium (STOOL SOFTENER PO) Take 2 capsules by mouth daily.    Marland Kitchen. ENSURE (ENSURE) Take 237 mLs by mouth 2 (two) times daily.    Marland Kitchen. EPINEPHrine (EPIPEN 2-PAK) 0.3 mg/0.3 mL IJ SOAJ injection USE AS DIRECTED FOR LIFE-THREATENING ALLERGIC REACTIONS 2 Device 1  . fluticasone furoate-vilanterol (BREO ELLIPTA) 200-25 MCG/INH AEPB Inhale 1 puff into the lungs daily. 1 each 0  . hydrOXYzine (ATARAX/VISTARIL) 25 MG tablet Take one tablet once or twice daily if needed 60 tablet 3  . montelukast (SINGULAIR) 10 MG tablet Take one tablet once daily 30 tablet 3  . omeprazole (PRILOSEC) 20 MG capsule Take 20 mg by mouth.    . ranitidine (ZANTAC) 150 MG tablet Take 150 mg by mouth 2 (two) times daily.   5  . sertraline (ZOLOFT) 50 MG tablet Take 50 mg by mouth daily.  1  . triamcinolone cream (KENALOG) 0.1 % Apply 1 application topically 2 (two) times daily.  0  . Umeclidinium Bromide 62.5 MCG/INH AEPB Inhale 1 puff into the lungs daily.    . fluticasone furoate-vilanterol (BREO ELLIPTA) 200-25 MCG/INH  AEPB Inhale 1 puff into the lungs daily.    . budesonide-formoterol (SYMBICORT) 80-4.5 MCG/ACT inhaler Inhale 2 puffs into the lungs 2 (two) times daily. 1 Inhaler 0  . omalizumab (XOLAIR) 150 MG injection Inject 300 mg into the skin.    Marland Kitchen traMADol (ULTRAM) 50 MG tablet Take 50 mg by mouth.     Facility-Administered Medications Prior to Visit  Medication Dose Route Frequency Provider Last Rate Last Dose  . omalizumab Geoffry Paradise) injection 300 mg  300 mg Subcutaneous Q28 days Jessica Priest, MD   300 mg at 03/29/16 1458     Allergies:   Patient has no known allergies.   Social History   Social History  . Marital status: Divorced    Spouse name: N/A  . Number of  children: N/A  . Years of education: N/A   Occupational History  . Retired    Social History Main Topics  . Smoking status: Current Some Day Smoker    Packs/day: 2.00    Years: 37.00    Types: Cigarettes    Start date: 01/11/1973  . Smokeless tobacco: Current User  . Alcohol use 0.0 oz/week     Comment: wine nightly   . Drug use: No  . Sexual activity: Not Asked   Other Topics Concern  . None   Social History Narrative  . None     Family History:  The patient's family history includes Emphysema in her maternal uncle; Lung cancer in her maternal uncle and mother.   ROS:   Please see the history of present illness.    ROS All other systems reviewed and are negative.   PHYSICAL EXAM:   VS:  BP 118/66 (BP Location: Right Arm, Patient Position: Sitting, Cuff Size: Large)   Pulse 74   Ht 5\' 4"  (1.626 m)   Wt 221 lb 12.8 oz (100.6 kg)   SpO2 97%   BMI 38.07 kg/m    GEN: Well nourished, well developed, in no acute distress  HEENT: normal  Neck: no JVD, carotid bruits, or masses Cardiac: RRR; no murmurs, rubs, or gallops, trace ankle edema bilaterally  Respiratory:  clear to auscultation bilaterally, normal work of breathing GI: soft, nontender, nondistended, + BS, overweight MS: no deformity or atrophy , right ankle pins noted Skin: warm and dry, no rash Neuro:  Alert and Oriented x 3, Strength and sensation are intact Psych: euthymic mood, full affect  Wt Readings from Last 3 Encounters:  04/07/16 221 lb 12.8 oz (100.6 kg)  03/31/16 216 lb (98 kg)  05/19/15 217 lb (98.4 kg)      Studies/Labs Reviewed:   EKG:  EKG is ordered today.  Sinus rhythm 67, old septal infarct pattern, no other specific abnormalities. Personally viewed. No change from prior.  Recent Labs: No results found for requested labs within last 8760 hours.   Lipid Panel No results found for: CHOL, TRIG, HDL, CHOLHDL, VLDL, LDLCALC, LDLDIRECT  Additional studies/ records that were reviewed  today include:   NUC stress 02/18/15   Nuclear stress EF: 65%.   There was no ST segment deviation noted during stress.   The study is normal.   The left ventricular ejection fraction is normal (55-65%).  1. No evidence for ischemia or infarction.  2. Normal EF.  3. Normal study.   ECHO 02/11/15: - Normal LV size with mild LV hypertrophy. EF 60-65%. Normal RV size and systolic function. No significant valvular abnormalities.  ASSESSMENT:    1. Coronary artery  calcification   2. Aortic atherosclerosis (HCC)   3. Obstructive chronic bronchitis without exacerbation (HCC)   4. Morbid obesity (HCC)   5. Lower extremity edema      PLAN:  In order of problems listed above:  Lower extremity edema  - Quite mild currently. This could be exacerbated by Celebrex, and said. Also use of steroids can increase overall edema.  - Can. He, reduce fluid intake, reduce salt intake. Also weight loss would be helpful for improvement of dependent type edema. I do not believe that she is in any signs of heart failure. Her prior ejection fraction is normal.  - I do not believe that this is a side effect of the atorvastatin. Expressed the importance of this for plaque stabilization especially given her aortic atherosclerosis and coronary artery calcification.  Coronary artery calcification/aortic atherosclerosis  - Continue with secondary prevention.   - Continue atorvastatin 20 mg. Excellent.  COPD  - Managed by pulmonary medicine.  - Continue to promote movement.  Tobacco use  - Encourage cessation once again.  - She undergoes lung cancer screening CTs.  Morbid obesity  - Strongly encourage weight loss which will help with her overall symptomatology. Decrease carbohydrates.  I'm comfortable with her coming back on as-needed basis. Please let us know if we can be of further assistance.  Medication Adjustments/Labs and Tests Ordered: Current medicines are reviewed at length with  the patient today.  Concerns regarding medicines are outlined above.  Medication changes, Labs and Tests ordered today are listed in the Patient Instructions below. Patient Instructions  Medication Instructions:  The current medical regimen is effective;  continue present plan and medications.  Follow-Up: Follow up as needed with Dr Anne Fu  If you need a refill on your cardiac medications before your next appointment, please call your pharmacy.  Thank you for choosing [facility]!!        Signed, Donato Schultz, MD  04/07/2016 9:55 AM    [facility] [address] [place], [place], [place]  [address] Phone: [phone]; Fax: [phone]

## 2016-04-26 ENCOUNTER — Ambulatory Visit: Payer: Self-pay

## 2016-04-27 ENCOUNTER — Ambulatory Visit (INDEPENDENT_AMBULATORY_CARE_PROVIDER_SITE_OTHER): Payer: Self-pay

## 2016-04-27 DIAGNOSIS — L500 Allergic urticaria: Secondary | ICD-10-CM

## 2016-05-25 ENCOUNTER — Telehealth: Payer: Self-pay | Admitting: Pediatrics

## 2016-05-25 ENCOUNTER — Ambulatory Visit (INDEPENDENT_AMBULATORY_CARE_PROVIDER_SITE_OTHER): Payer: Self-pay | Admitting: *Deleted

## 2016-05-25 DIAGNOSIS — L501 Idiopathic urticaria: Secondary | ICD-10-CM

## 2016-05-25 DIAGNOSIS — J4540 Moderate persistent asthma, uncomplicated: Secondary | ICD-10-CM

## 2016-05-25 NOTE — Telephone Encounter (Signed)
No answer - will try again later

## 2016-05-25 NOTE — Telephone Encounter (Signed)
Missing paper work from [facility]. Please call her back

## 2016-05-31 NOTE — Telephone Encounter (Signed)
Left message to call me back.

## 2016-06-02 NOTE — Telephone Encounter (Signed)
Called pt back today - she still has not rec'd papers [facility] to apply for financial aid - I told her to call them back - kt

## 2016-06-22 ENCOUNTER — Ambulatory Visit (INDEPENDENT_AMBULATORY_CARE_PROVIDER_SITE_OTHER): Payer: Self-pay

## 2016-06-22 DIAGNOSIS — L501 Idiopathic urticaria: Secondary | ICD-10-CM

## 2016-06-22 DIAGNOSIS — J4540 Moderate persistent asthma, uncomplicated: Secondary | ICD-10-CM

## 2016-07-20 ENCOUNTER — Ambulatory Visit: Payer: Self-pay

## 2016-08-18 ENCOUNTER — Ambulatory Visit (HOSPITAL_BASED_OUTPATIENT_CLINIC_OR_DEPARTMENT_OTHER): Payer: Self-pay

## 2016-09-01 ENCOUNTER — Ambulatory Visit (HOSPITAL_BASED_OUTPATIENT_CLINIC_OR_DEPARTMENT_OTHER): Payer: Self-pay

## 2016-09-08 ENCOUNTER — Ambulatory Visit (HOSPITAL_BASED_OUTPATIENT_CLINIC_OR_DEPARTMENT_OTHER): Payer: Self-pay

## 2016-09-30 ENCOUNTER — Telehealth: Payer: Self-pay

## 2016-09-30 IMAGING — CT CT CHEST LUNG CANCER SCREENING LOW DOSE W/O CM
2 of 4 series · 15 of 40 positions shown, 18 images · non-contrast
Comparison: 05/15/2015, 12/02/2014 and 03/16/2013.

CLINICAL DATA: Abnormal lung cancer screening exam, three-month
follow-up. Current smoker 74 pack-year history.

EXAM:
CT CHEST WITHOUT CONTRAST LOW-DOSE FOR LUNG CANCER SCREENING
TECHNIQUE: Multidetector CT imaging of the chest was performed following the
standard protocol without IV contrast.

[Series 2: axial st · axial · 0.66mm/px · z∈[-320,-45]mm · 12 of 65 slices shown, 15 images]
[im 5/65  mediastinal]
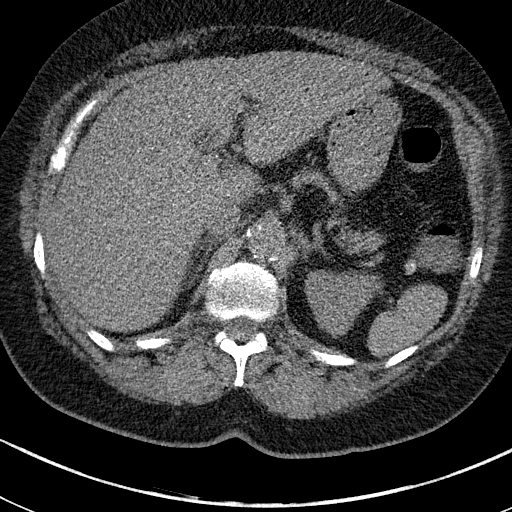
[im 5/65  lung]
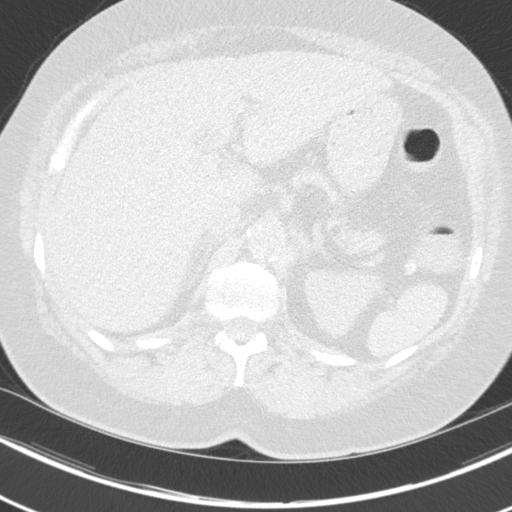
[im 10/65  lung]
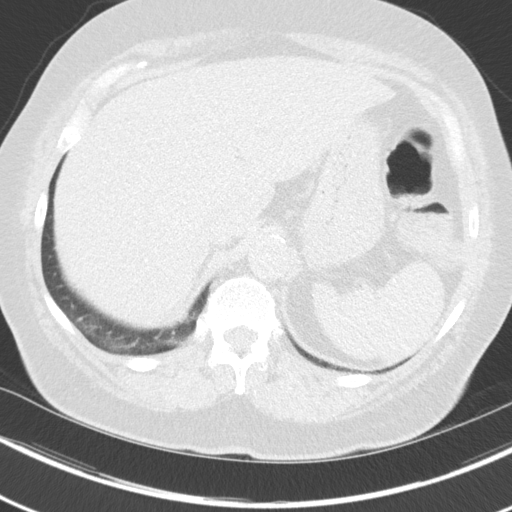
[im 15/65  lung]
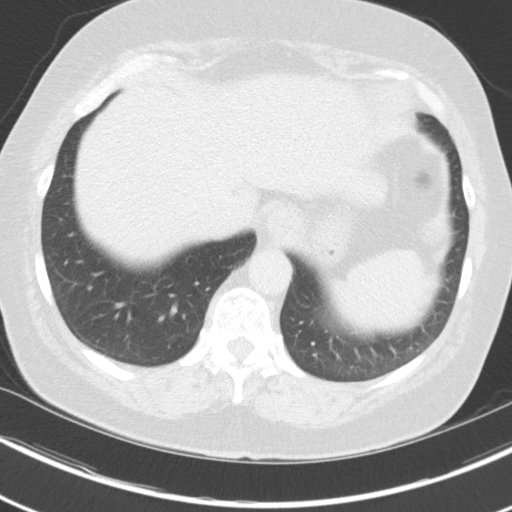
[im 20/65  lung]
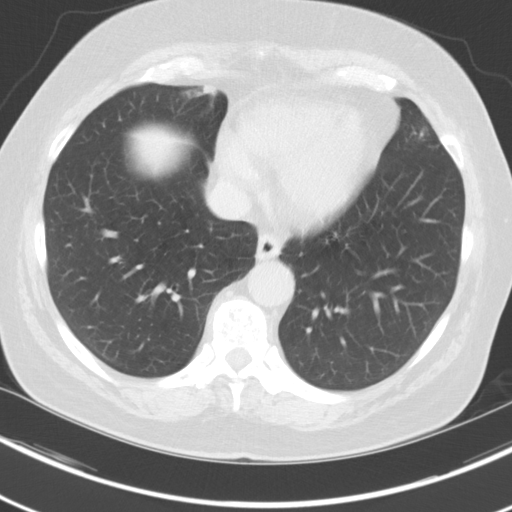
[im 25/65  mediastinal]
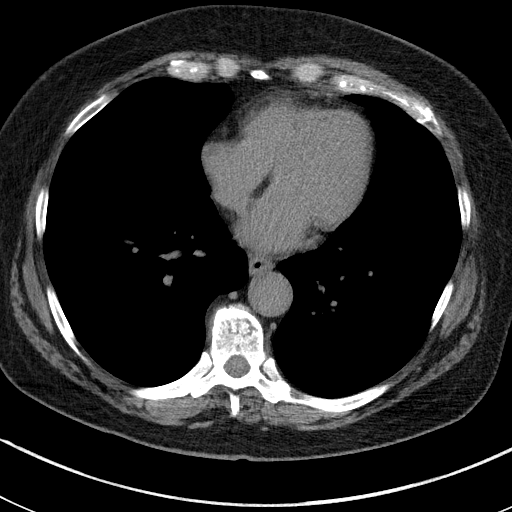
[im 25/65  lung]
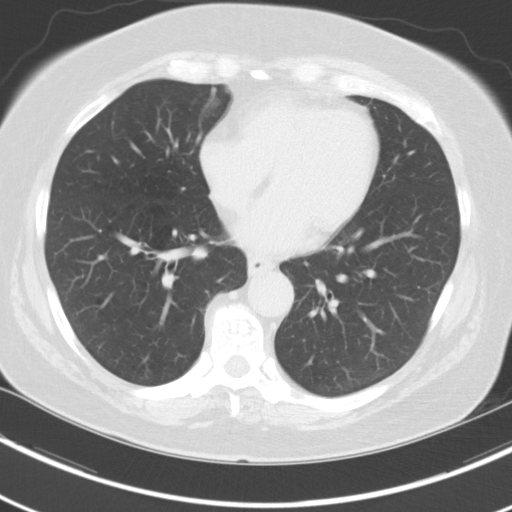
[im 30/65  lung]
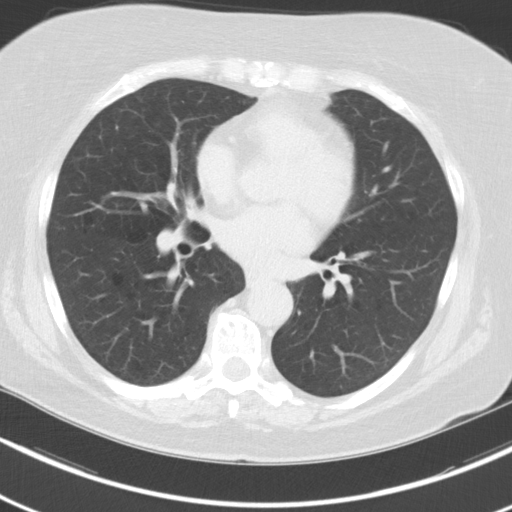
[im 35/65  lung]
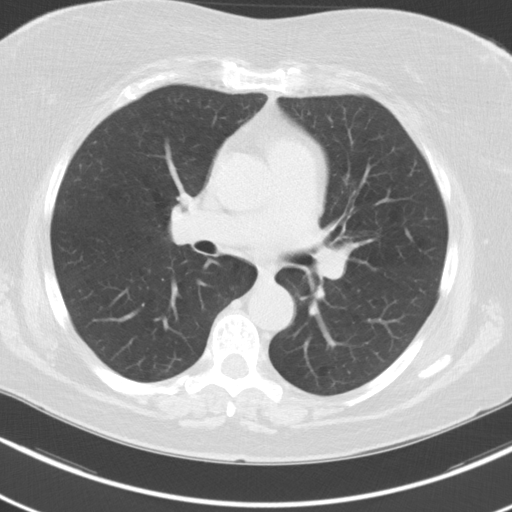
[im 40/65  lung]
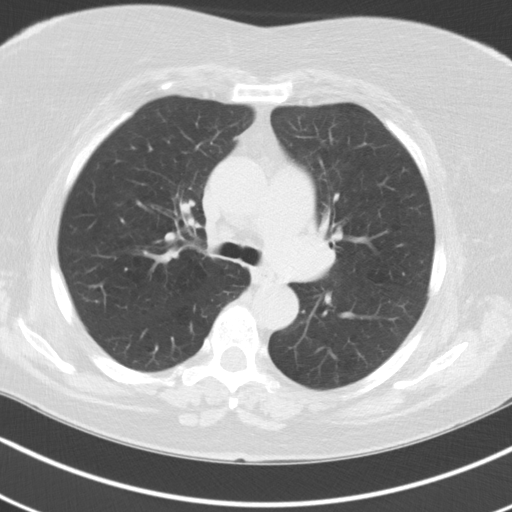
[im 45/65  mediastinal]
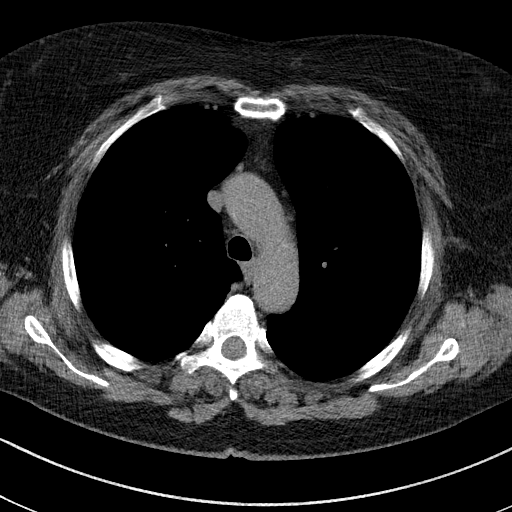
[im 45/65  lung]
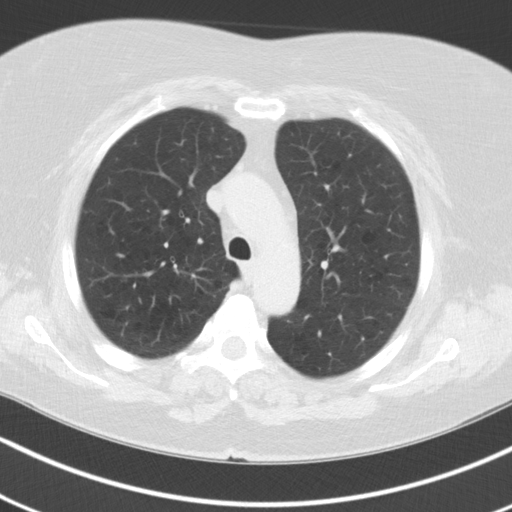
[im 50/65  lung]
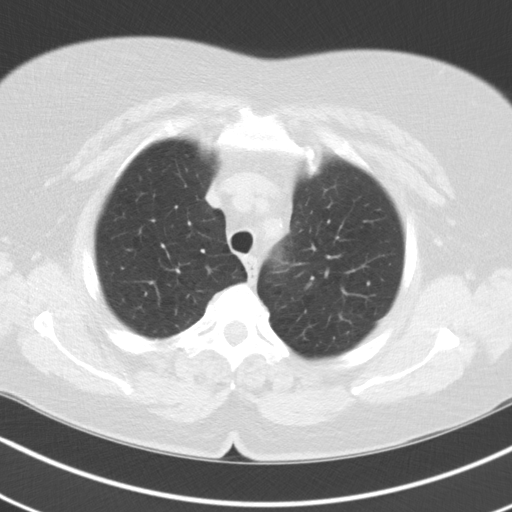
[im 55/65  lung]
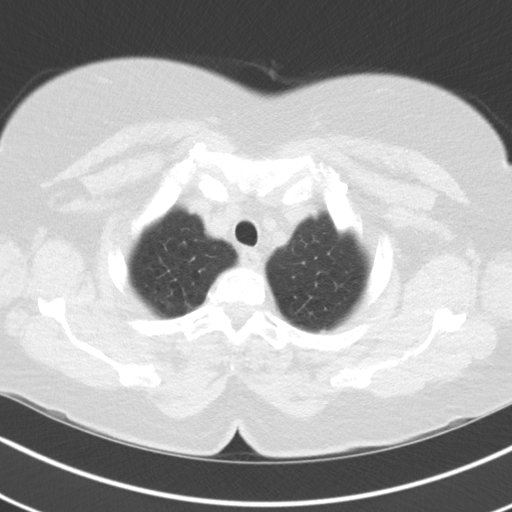
[im 60/65  lung]
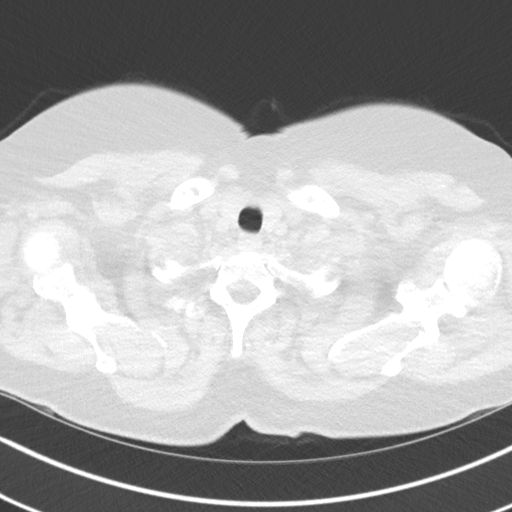

[Series 5: coronal · coronal · 0.64mm/px · 3 of 335 slices shown]
[im 67/335  lung]
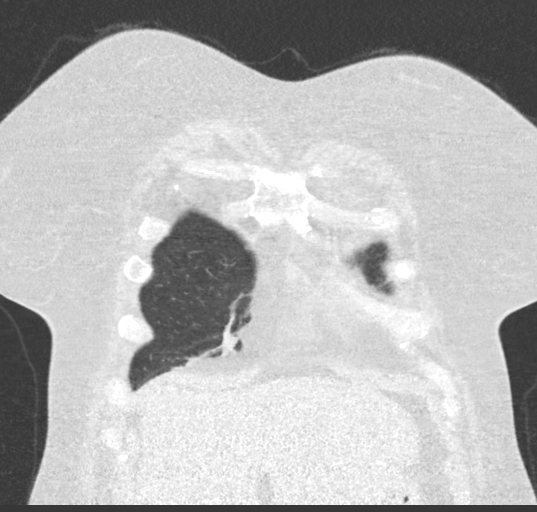
[im 134/335  lung]
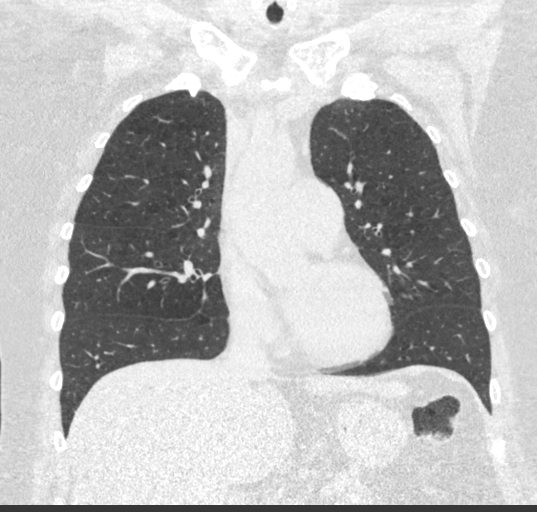
[im 201/335  lung]
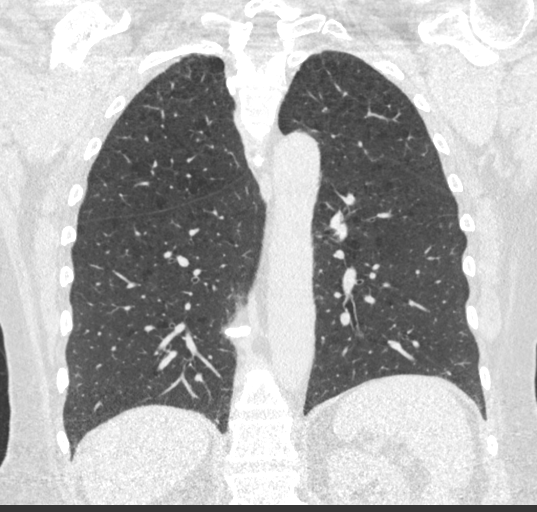

[15 of 40 positions shown; findings below may reference images not displayed]

FINDINGS: Cardiovascular: Atherosclerotic calcification of the arterial
vasculature, including coronary arteries. Heart size normal. No
pericardial effusion. Pulmonary arteries are enlarged.

Mediastinum/Nodes: Mediastinal lymph nodes are not enlarged by CT
size criteria. Hilar regions are difficult to definitively evaluate
without IV contrast but appear grossly unremarkable. No axillary
adenopathy. Esophagus is grossly unremarkable.

Lungs/Pleura: Centrilobular emphysema. No worrisome pulmonary
nodules. Scarring is seen in the right middle lobe and lingula. No
pleural fluid. Airway is unremarkable.

Upper abdomen: 1.8 cm low-attenuation lesion in the left hepatic
lobe is unchanged. Visualized portions of the liver, adrenal glands,
left kidney, spleen, pancreas, stomach and bowel are otherwise
unremarkable.

Musculoskeletal: No worrisome lytic or sclerotic lesions.
Degenerative changes are seen in the spine.
IMPRESSION: 1. Lung-RADS Category 2, benign appearance or behavior. Continue
annual screening with low-dose chest CT without contrast in 12
months.
2. Aortic atherosclerosis and coronary artery calcification.
3. Enlarged pulmonary arteries, indicative of pulmonary arterial
hypertension.

## 2016-09-30 NOTE — Telephone Encounter (Signed)
Surgical clearance has been faxed to [facility]. Received succussful fax confirmation. Nothing further needed.

## 2016-10-07 ENCOUNTER — Ambulatory Visit: Payer: Self-pay | Admitting: *Deleted

## 2016-10-28 ENCOUNTER — Other Ambulatory Visit: Payer: Self-pay | Admitting: Allergy and Immunology

## 2016-11-18 ENCOUNTER — Other Ambulatory Visit: Payer: Self-pay | Admitting: Allergy and Immunology

## 2016-12-15 ENCOUNTER — Telehealth: Payer: Self-pay | Admitting: Cardiology

## 2016-12-15 NOTE — Telephone Encounter (Signed)
I spoke with the patient. She has no history of MI. No chest pain.     Primary Cardiologist: No primary care provider on file.  Chart reviewed as part of pre-operative protocol coverage. Given past medical history and time since last visit, based on ACC/AHA guidelines, [name] would be at acceptable risk for the planned procedure without further cardiovascular testing.   I will route this recommendation to the requesting party via Epic fax function and remove from pre-op pool.  Please call with questions.  [name], PA-C 12/15/2016, 4:38 PM

## 2016-12-15 NOTE — Telephone Encounter (Signed)
Clearance faxed to Dr.Lennon's office at fax # [phone].

## 2016-12-15 NOTE — Telephone Encounter (Signed)
° °  [facility] Pre-operative Risk Assessment    Request for surgical clearance:  1. What type of surgery is being performed? Total Right Hip replacement   2. When is this surgery scheduled? 02/01/17   3. Are there any medications that need to be held prior to surgery and how long? If there is any medications that need to be held, please inform us.    4. Practice name and name of physician performing surgery? [facility], Dr. Len Childs  5. What is your office phone and fax number? PH# [phone], Fax# [phone]  6. Anesthesia type (None, local, MAC, general) ? General    Amber Scott 12/15/2016, 9:35 AM  _________________________________________________________________   (provider comments below)

## 2016-12-17 NOTE — Telephone Encounter (Signed)
error 

## 2016-12-31 ENCOUNTER — Telehealth: Payer: Self-pay

## 2016-12-31 NOTE — Telephone Encounter (Signed)
Pt has been scheduled for 01/29/16 for surgical clearance, as this was first available.  Surgery is scheduled for 02/02/16.  PM please advise if this apt is okay or if pt should be seen sooner. Thanks.,

## 2016-12-31 NOTE — Telephone Encounter (Signed)
I will open up some clinic spots. We will get in touch with her soon. Thanks. Keep the 01/29/16 date for now. Thanks  Chilton GreathousePraveen Kimbely Whiteaker MD [facility] 12/31/2016, 1:33 PM

## 2016-12-31 NOTE — Telephone Encounter (Signed)
-----   Message from Chilton GreathousePraveen Mannam, MD sent at 12/30/2016  2:47 PM EST ----- Regarding: Surgery clearence I received request for surgery cleaence for hip arthroplasty on 02/01/17 Can you call and make an appointment to be seen in clinic at next available as she has not been to see in is some time  Thanks

## 2016-12-31 NOTE — Telephone Encounter (Signed)
-----   Message from Praveen Mannam, MD sent at 12/30/2016  2:47 PM EST ----- Regarding: Surgery clearence I received request for surgery cleaence for hip arthroplasty on 02/01/17 Can you call and make an appointment to be seen in clinic at next available as she has not been to see in is some time  Thanks 

## 2017-01-03 NOTE — Telephone Encounter (Signed)
Schedule for 10th or 11th

## 2017-01-05 NOTE — Telephone Encounter (Signed)
Called and spoke with pt letting her know that PM had opened up some clinic spots and we could get her in with him either 1/10 or 1/11. Pt's appt changed from 1/18 to 1/10 at 12:00. Nothing further needed.

## 2017-01-20 ENCOUNTER — Encounter: Payer: Self-pay | Admitting: Pulmonary Disease

## 2017-01-20 ENCOUNTER — Ambulatory Visit (INDEPENDENT_AMBULATORY_CARE_PROVIDER_SITE_OTHER): Payer: Medicaid Other | Admitting: Pulmonary Disease

## 2017-01-20 VITALS — BP 120/64 | HR 67 | Ht 64.5 in | Wt 188.0 lb

## 2017-01-20 DIAGNOSIS — Z23 Encounter for immunization: Secondary | ICD-10-CM | POA: Diagnosis not present

## 2017-01-20 DIAGNOSIS — F17210 Nicotine dependence, cigarettes, uncomplicated: Secondary | ICD-10-CM | POA: Diagnosis not present

## 2017-01-20 DIAGNOSIS — J449 Chronic obstructive pulmonary disease, unspecified: Secondary | ICD-10-CM

## 2017-01-20 MED ORDER — AZITHROMYCIN 250 MG PO TABS: ORAL_TABLET | ORAL | 0 refills | Status: AC

## 2017-01-20 MED ORDER — AZITHROMYCIN 250 MG PO TABS: ORAL_TABLET | ORAL | 0 refills | Status: DC

## 2017-01-20 NOTE — Patient Instructions (Signed)
We will call in a Z-Pak for treatment of bronchitis Continue the inhalers You are stable to proceed with surgery Follow-up 6 months.

## 2017-01-20 NOTE — Progress Notes (Signed)
Amber Scott    [id number]    [birth year]  Primary Care Physician:Cousins, Caleen Jobs, FNP  Referring Physician: Amelia Jo, FNP [address] [place], [place] [address]  Chief complaint:   COPD GOLD B (CAT Score 23, 0 exacerbations over past year) Active smoker  Per op clearance for hip surgery   HPI: Amber Scott is a 62 year old patient with heavy smoking history, COPD. She is a former patient of Dr. Delford Field. She was evaluated in 2015 for recurrent pneumonia. A CT on 03/16/13 showed a lingular opacity. She underwent bronchoscopy for this. There is no evidence of malignancy and the cultures grew hemolytic streptococci. She was treated with antibiotics with improvement in symptoms. She is on Xolair per dr. Lucie Leather for recurrent utricaria.  She was hospitalized last month for ankle fracture s/p ORIF. She also had a cardiology eval earlier on 2017 with negative stress test and echo.   Interim history: Symptoms are stable with chronic dyspnea on exertion, cough with sputum production.  She continues on Brio and incruse.  She is also on Xolair for allergic urticaria.  Here for preop clearance for scheduled right hip replacement.  She underwent left hip replacement in September 2018 without any issue.  Outpatient Encounter Medications as of 01/20/2017  Medication Sig  . acetaminophen (TYLENOL) 325 MG tablet Take 650 mg by mouth.  Marland Kitchen albuterol (PROVENTIL) (2.5 MG/3ML) 0.083% nebulizer solution Take 2.5 mg by nebulization every 6 (six) hours as needed for wheezing or shortness of breath.  Marland Kitchen amLODipine (NORVASC) 10 MG tablet Take 10 mg by mouth daily.  Marland Kitchen aspirin 81 MG tablet Take 81 mg by mouth daily.  Marland Kitchen BEPREVE 1.5 % SOLN INSTILL 1 DROP INTO EACH EYE TWICE DAILY AS NEEDED  . cetirizine (ZYRTEC) 10 MG tablet TAKE 1 TABLET BY MOUTH EVERY DAY  . Docusate Calcium (STOOL SOFTENER PO) Take 2 capsules by mouth daily.  Marland Kitchen ENSURE (ENSURE) Take 237 mLs by mouth 2 (two) times daily.    Marland Kitchen EPINEPHrine (EPIPEN 2-PAK) 0.3 mg/0.3 mL IJ SOAJ injection USE AS DIRECTED FOR LIFE-THREATENING ALLERGIC REACTIONS  . fluticasone furoate-vilanterol (BREO ELLIPTA) 200-25 MCG/INH AEPB Inhale 1 puff into the lungs daily.  Marland Kitchen gabapentin (NEURONTIN) 300 MG capsule Take 300 mg by mouth.  . hydrOXYzine (ATARAX/VISTARIL) 25 MG tablet TAKE 1 TABLET BY MOUTH ONCE TO TWICE A DAY IF NEEDED  . montelukast (SINGULAIR) 10 MG tablet TAKE 1 TABLET BY MOUTH EVERY DAY  . omeprazole (PRILOSEC) 20 MG capsule Take 20 mg by mouth.  Melene Muller ON 01/24/2017] oxyCODONE-acetaminophen (PERCOCET) 10-325 MG tablet Take by mouth.  . ranitidine (ZANTAC) 150 MG tablet TAKE 1 TABLET BY MOUTH TWICE DAILY  . sertraline (ZOLOFT) 50 MG tablet Take 50 mg by mouth daily.  Marland Kitchen triamcinolone cream (KENALOG) 0.1 % Apply 1 application topically 2 (two) times daily.  Marland Kitchen Umeclidinium Bromide 62.5 MCG/INH AEPB Inhale 1 puff into the lungs daily.  . [DISCONTINUED] fluticasone furoate-vilanterol (BREO ELLIPTA) 200-25 MCG/INH AEPB Inhale 1 puff into the lungs daily.  Marland Kitchen atorvastatin (LIPITOR) 20 MG tablet Take 20 mg by mouth.   Facility-Administered Encounter Medications as of 01/20/2017  Medication  . omalizumab Geoffry Paradise) injection 300 mg    Allergies as of 01/20/2017  . (No Known Allergies)    Past Medical History:  Diagnosis Date  . Hypertension   . Urticaria     Past Surgical History:  Procedure Laterality Date  . CHOLECYSTECTOMY    .  VESICOVAGINAL FISTULA CLOSURE W/ TAH    . VIDEO BRONCHOSCOPY Bilateral 04/10/2013   Procedure: VIDEO BRONCHOSCOPY WITH FLUORO;  Surgeon: Storm Frisk, MD;  Location: [facility];  Service: Cardiopulmonary;  Laterality: Bilateral;    Family History  Problem Relation Age of Onset  . Lung cancer Mother   . Emphysema Maternal Uncle        unlce  . Lung cancer Maternal Uncle   . Allergic rhinitis Neg Hx   . Angioedema Neg Hx   . Asthma Neg Hx   . Atopy Neg Hx   . Eczema Neg Hx   .  Immunodeficiency Neg Hx   . Urticaria Neg Hx     Social History   Socioeconomic History  . Marital status: Divorced    Spouse name: Not on file  . Number of children: Not on file  . Years of education: Not on file  . Highest education level: Not on file  Social Needs  . Financial resource strain: Not on file  . Food insecurity - worry: Not on file  . Food insecurity - inability: Not on file  . Transportation needs - medical: Not on file  . Transportation needs - non-medical: Not on file  Occupational History  . Occupation: Retired  Tobacco Use  . Smoking status: Current Some Day Smoker    Packs/day: 1.00    Years: 37.00    Pack years: 37.00    Types: Cigarettes    Start date: 01/11/1973  . Smokeless tobacco: Current User  Substance and Sexual Activity  . Alcohol use: Yes    Alcohol/week: 0.0 oz    Comment: wine nightly   . Drug use: No  . Sexual activity: Not on file  Other Topics Concern  . Not on file  Social History Narrative  . Not on file    Review of systems: Review of Systems  Constitutional: Negative for fever and chills.  HENT: Negative.   Eyes: Negative for blurred vision.  Respiratory: as per HPI  Cardiovascular: Negative for chest pain and palpitations.  Gastrointestinal: Negative for vomiting, diarrhea, blood per rectum. Genitourinary: Negative for dysuria, urgency, frequency and hematuria.  Musculoskeletal: Negative for myalgias, back pain and joint pain.  Skin: Negative for itching and rash.  Neurological: Negative for dizziness, tremors, focal weakness, seizures and loss of consciousness.  Endo/Heme/Allergies: Negative for environmental allergies.  Psychiatric/Behavioral: Negative for depression, suicidal ideas and hallucinations.  All other systems reviewed and are negative.  Physical Exam: Blood pressure 120/64, pulse 67, height 5' 4.5" (1.638 m), weight 188 lb (85.3 kg), SpO2 94 %. Gen:      No acute distress HEENT:  EOMI, sclera  anicteric Neck:     No masses; no thyromegaly Lungs:    Clear to auscultation bilaterally; normal respiratory effort CV:         Regular rate and rhythm; no murmurs Abd:      + bowel sounds; soft, non-tender; no palpable masses, no distension Ext:    No edema; adequate peripheral perfusion Skin:      Warm and dry; no rash Neuro: alert and oriented x 3 Psych: normal mood and affect  Data Reviewed: Spirometry (03/27/13) FVC 3.69 (115%), FEV1 1.96 (76%), F/F 53  Spirometry (12/24/14) FVC 3.20 (97%), FEV1 2.05 (78%), F/F 64 Moderate obstruction.  Echo 02/11/15 LVEF 60-65%, Mild LVH  Stress test 02/18/15 1. No evidence for ischemia or infarction.  2. Normal EF.  3. Normal study.   Screening CT chest 08/18/15  Emphysema, no lung nodules. Scarring in the right middle lobe and lingular.  Assessment:  #1 COPD GOLD C Stable on breo and incruse.  She is at mild-moderate risk of perioperative complication for the upcoming surgery but she tolerated pervious surgery without any issue. She is cleared for hip replacement.  Recommend early mobilization, continue inhalers during admission, use incentive spirometry  #2 Smoking. Smoking cessation encouraged. Continue lung cancer screening follow up  #3 Allergies, utricaria. On xolair  Plan/Recommendations: - Continue screening CT chest - Continue breo, incruse. - Smoking cessation - Cleared for hip surgery.  Chilton GreathousePraveen David Towson MD [facility] Pager [phone] 01/20/2017, 12:26 PM  CC: Amelia Joousins, Melissa A, FNP

## 2017-01-24 ENCOUNTER — Telehealth: Payer: Self-pay

## 2017-01-24 NOTE — Telephone Encounter (Signed)
-----   Message from Chilton GreathousePraveen Mannam, MD sent at 01/24/2017  6:00 AM EST ----- Can you send note to the Orth[facility] [place] She missed her low dose screening CT in 2018. Can you check and make sure it is reordered. Thanks

## 2017-01-24 NOTE — Telephone Encounter (Signed)
Confirmed physicians name that will be doing surgery with pt.  OV note has been placed to Dr. Thamas JaegersLennon with [facility]. Nothing further is needed.   Angelique BlonderDenise can you f/u on overdue low dose CT. It appears that pending order has expired. Thanks.

## 2017-01-25 NOTE — Telephone Encounter (Signed)
[facility] will contact pt to reschedule low dose CT.

## 2017-01-28 ENCOUNTER — Ambulatory Visit: Payer: Self-pay | Admitting: Pulmonary Disease

## 2017-02-07 ENCOUNTER — Other Ambulatory Visit: Payer: Self-pay | Admitting: Allergy and Immunology

## 2017-02-11 ENCOUNTER — Other Ambulatory Visit: Payer: Self-pay | Admitting: Acute Care

## 2017-02-11 DIAGNOSIS — F17210 Nicotine dependence, cigarettes, uncomplicated: Principal | ICD-10-CM

## 2017-02-11 DIAGNOSIS — Z122 Encounter for screening for malignant neoplasm of respiratory organs: Secondary | ICD-10-CM

## 2017-02-16 ENCOUNTER — Ambulatory Visit (HOSPITAL_BASED_OUTPATIENT_CLINIC_OR_DEPARTMENT_OTHER)
Admission: RE | Admit: 2017-02-16 | Discharge: 2017-02-16 | Disposition: A | Discharge status: Home / Self Care | Payer: Medicaid Other | Source: Ambulatory Visit | Attending: Acute Care | Admitting: Acute Care

## 2017-02-16 DIAGNOSIS — F17210 Nicotine dependence, cigarettes, uncomplicated: Secondary | ICD-10-CM | POA: Diagnosis not present

## 2017-02-16 DIAGNOSIS — Z122 Encounter for screening for malignant neoplasm of respiratory organs: Secondary | ICD-10-CM | POA: Diagnosis not present

## 2017-02-16 DIAGNOSIS — I700 Atherosclerosis of aorta: Secondary | ICD-10-CM | POA: Insufficient documentation

## 2017-02-16 DIAGNOSIS — J432 Centrilobular emphysema: Secondary | ICD-10-CM | POA: Insufficient documentation

## 2017-02-16 DIAGNOSIS — I2510 Atherosclerotic heart disease of native coronary artery without angina pectoris: Secondary | ICD-10-CM | POA: Diagnosis not present

## 2017-02-22 ENCOUNTER — Telehealth: Payer: Self-pay | Admitting: Acute Care

## 2017-02-22 DIAGNOSIS — Z87891 Personal history of nicotine dependence: Secondary | ICD-10-CM

## 2017-02-22 DIAGNOSIS — Z122 Encounter for screening for malignant neoplasm of respiratory organs: Secondary | ICD-10-CM

## 2017-02-23 NOTE — Telephone Encounter (Signed)
Pt is calling back about results. Cb is [phone].

## 2017-02-24 NOTE — Telephone Encounter (Signed)
Pt informed of CT results per Kandice RobinsonsSarah Groce, NP.  PT verbalized understanding.  Copy sent to Dr Jenita SeashoreSamuel Kelly and Dr Donato SchultzMark Skains.  Order placed for 1 yr f/u CT.

## 2017-02-24 NOTE — Telephone Encounter (Signed)
LMTC x 1  

## 2017-05-17 ENCOUNTER — Encounter: Payer: Self-pay | Admitting: Allergy and Immunology

## 2017-05-17 ENCOUNTER — Ambulatory Visit: Payer: Medicaid Other | Admitting: Allergy and Immunology

## 2017-05-17 ENCOUNTER — Telehealth: Payer: Self-pay | Admitting: Pulmonary Disease

## 2017-05-17 VITALS — BP 160/80 | HR 56 | Resp 16 | Wt 197.6 lb

## 2017-05-17 DIAGNOSIS — L501 Idiopathic urticaria: Secondary | ICD-10-CM

## 2017-05-17 DIAGNOSIS — H1010 Acute atopic conjunctivitis, unspecified eye: Secondary | ICD-10-CM

## 2017-05-17 LAB — PULMONARY FUNCTION TEST

## 2017-05-17 MED ORDER — EPINEPHRINE 0.3 MG/0.3ML IJ SOAJ: INTRAMUSCULAR | 1 refills | Status: AC

## 2017-05-17 MED ORDER — OLOPATADINE HCL 0.1 % OP SOLN: OPHTHALMIC | 5 refills | Status: DC

## 2017-05-17 MED ORDER — RANITIDINE HCL 150 MG PO TABS: 150.0000 mg | ORAL_TABLET | Freq: Two times a day (BID) | ORAL | 1 refills | Status: DC

## 2017-05-17 MED ORDER — MONTELUKAST SODIUM 10 MG PO TABS: 10.0000 mg | ORAL_TABLET | Freq: Every day | ORAL | 1 refills | Status: DC

## 2017-05-17 MED ORDER — CETIRIZINE HCL 10 MG PO TABS: ORAL_TABLET | ORAL | 1 refills | Status: DC

## 2017-05-17 NOTE — Progress Notes (Signed)
Follow-up Note  Referring Provider: Amelia Jo, FNP Primary Provider: Amelia Jo, FNP Date of Office Visit: 05/17/2017  Subjective:   Amber Scott (DOB: 29-Jul-1955) is a 62 y.o. female [facility] in re-evaluation of the following:  HPI: Amber Scott returns to this clinic in reevaluation of her chronic urticaria.  Her last visit to this clinic was 23 March 2016.  During the interval she has undergone 2 hip replacements and she had to discontinue her omalizumab around June because of this issue.  She has slowly redeveloped hives even while consistently using cetirizine and ranitidine and montelukast.  She has not had any associated systemic or constitutional symptoms.  She does have a history of respiratory tract issues followed by a pulmonologist and she is presently using trelergy as well as other medications to address this issue.  She is no longer smoking and has not smoked since January 2019.  He has had some issues with itchy eyes and watery eyes since the spring is arrived.  Allergies as of 05/17/2017   No Known Allergies     Medication List      acetaminophen 325 MG tablet Commonly known as:  TYLENOL Take 650 mg by mouth.   albuterol (2.5 MG/3ML) 0.083% nebulizer solution Commonly known as:  PROVENTIL Take 2.5 mg by nebulization every 6 (six) hours as needed for wheezing or shortness of breath.   amLODipine 5 MG tablet Commonly known as:  NORVASC Take by mouth.   aspirin 81 MG tablet Take 81 mg by mouth daily.   atorvastatin 20 MG tablet Commonly known as:  LIPITOR Take 20 mg by mouth.   atorvastatin 10 MG tablet Commonly known as:  LIPITOR Take 10 mg by mouth at bedtime.   azithromycin 250 MG tablet Commonly known as:  ZITHROMAX Take as directed.   BEPREVE 1.5 % Soln Generic drug:  Bepotastine Besilate INSTILL 1 DROP INTO EACH EYE TWICE DAILY AS NEEDED   BREO ELLIPTA 200-25 MCG/INH  Aepb Generic drug:  fluticasone furoate-vilanterol Inhale 1 puff into the lungs daily.   cetirizine 10 MG tablet Commonly known as:  ZYRTEC TAKE 1 TABLET BY MOUTH EVERY DAY   ENSURE Take 237 mLs by mouth 2 (two) times daily.   EPINEPHrine 0.3 mg/0.3 mL Soaj injection Commonly known as:  EPIPEN 2-PAK USE AS DIRECTED FOR LIFE-THREATENING ALLERGIC REACTIONS   gabapentin 300 MG capsule Commonly known as:  NEURONTIN Take 300 mg by mouth.   gabapentin 600 MG tablet Commonly known as:  NEURONTIN TAKE 1 Tablet BY MOUTH 4 TIMES DAILY   hydrOXYzine 25 MG tablet Commonly known as:  ATARAX/VISTARIL TAKE 1 TABLET BY MOUTH ONCE TO TWICE A DAY IF NEEDED   montelukast 10 MG tablet Commonly known as:  SINGULAIR TAKE 1 TABLET BY MOUTH EVERY DAY   omeprazole 20 MG capsule Commonly known as:  PRILOSEC Take 20 mg by mouth.   ranitidine 150 MG tablet Commonly known as:  ZANTAC TAKE 1 TABLET BY MOUTH TWICE DAILY   sertraline 50 MG tablet Commonly known as:  ZOLOFT Take 50 mg by mouth daily.   STOOL SOFTENER PO Take 2 capsules by mouth daily.   traMADol 50 MG tablet Commonly known as:  ULTRAM Take by mouth.   triamcinolone cream 0.1 % Commonly known as:  KENALOG Apply 1 application topically 2 (two) times daily.   umeclidinium bromide 62.5 MCG/INH Aepb Commonly known as:  INCRUSE ELLIPTA Inhale  1 puff into the lungs daily.       Past Medical History:  Diagnosis Date  . History of tobacco abuse   . Hypertension   . Urticaria     Past Surgical History:  Procedure Laterality Date  . CHOLECYSTECTOMY    . TOTAL HIP ARTHROPLASTY    . VESICOVAGINAL FISTULA CLOSURE W/ TAH    . VIDEO BRONCHOSCOPY Bilateral 04/10/2013   Procedure: VIDEO BRONCHOSCOPY WITH FLUORO;  Surgeon: Storm Frisk, MD;  Location: [facility];  Service: Cardiopulmonary;  Laterality: Bilateral;    Review of systems negative except as noted in HPI / PMHx or noted below:  Review of Systems   Constitutional: Negative.   HENT: Negative.   Eyes: Negative.   Respiratory: Negative.   Cardiovascular: Negative.   Gastrointestinal: Negative.   Genitourinary: Negative.   Musculoskeletal: Negative.   Skin: Negative.   Neurological: Negative.   Endo/Heme/Allergies: Negative.   Psychiatric/Behavioral: Negative.      Objective:   Vitals:   05/17/17 1159  BP: (!) 160/80  Pulse: (!) 56  Resp: 16      Weight: 197 lb 9.6 oz (89.6 kg)   Physical Exam  HENT:  Head: Normocephalic.  Right Ear: Tympanic membrane, external ear and ear canal normal.  Left Ear: Tympanic membrane, external ear and ear canal normal.  Nose: Nose normal. No mucosal edema or rhinorrhea.  Mouth/Throat: Uvula is midline, oropharynx is clear and moist and mucous membranes are normal. No oropharyngeal exudate.  Eyes: Conjunctivae are normal.  Neck: Trachea normal. No tracheal tenderness present. No tracheal deviation present. No thyromegaly present.  Cardiovascular: Normal rate, regular rhythm, S1 normal, S2 normal and normal heart sounds.  No murmur heard. Pulmonary/Chest: Breath sounds normal. No stridor. No respiratory distress. She has no wheezes. She has no rales.  Musculoskeletal: She exhibits no edema.  Lymphadenopathy:       Head (right side): No tonsillar adenopathy present.       Head (left side): No tonsillar adenopathy present.    She has no cervical adenopathy.  Neurological: She is alert.  Skin: No rash noted. She is not diaphoretic. No erythema. Nails show no clubbing.    Diagnostics: none  Assessment and Plan:   1. Idiopathic urticaria   2. Seasonal allergic conjunctivitis     1. Restart Xolair and EpiPen  2. Continue cetirizine 10 mg twice a day and ranitidine 150 mg twice a day and montelukast  one time per day  3. Can use Patanol one drop each eye 2 times per day if needed  4. Return to clinic in 6 months or earlier if problem   We will start Amber Scott back on  omalizumab and I suspect that this will take care of her chronic urticaria and probably her requirement for all the medications required to get this issue under control.  I will see her back in this clinic in 6 months or earlier if there is a problem.  [name], MD Allergy / Immunology [facility]

## 2017-05-17 NOTE — Telephone Encounter (Signed)
Spoke with pt. She is currently taking Breo and Incruse. Pt received a brochure in the mail about Trelegy. She would like to have a prescription for this so she would only have 1 inhaler and 1 copay. Pt is also requesting a prescription for an Albuterol HFA inhaler, there is not one on her medication list.  Dr. Isaiah Serge - please advise on these 2 prescriptions. Thanks!

## 2017-05-17 NOTE — Patient Instructions (Signed)
   1. Restart Xolair and EpiPen  2. Continue cetirizine 10 mg twice a day and ranitidine 150 mg twice a day and montelukast  one time per day  3. Can use Patanol one drop each eye 2 times per day if needed  4. Return to clinic in 6 months or earlier if problem

## 2017-05-18 ENCOUNTER — Other Ambulatory Visit: Payer: Self-pay

## 2017-05-18 ENCOUNTER — Encounter: Payer: Self-pay | Admitting: Allergy and Immunology

## 2017-05-18 MED ORDER — CETIRIZINE HCL 10 MG PO TABS: ORAL_TABLET | ORAL | 1 refills | Status: DC

## 2017-05-18 MED ORDER — ALBUTEROL SULFATE HFA 108 (90 BASE) MCG/ACT IN AERS: 2.0000 | INHALATION_SPRAY | Freq: Four times a day (QID) | RESPIRATORY_TRACT | 1 refills | Status: DC | PRN

## 2017-05-18 MED ORDER — PATADAY 0.2 % OP SOLN: OPHTHALMIC | 5 refills | Status: DC

## 2017-05-18 MED ORDER — FLUTICASONE-UMECLIDIN-VILANT 100-62.5-25 MCG/INH IN AEPB: 1.0000 | INHALATION_SPRAY | Freq: Every day | RESPIRATORY_TRACT | 3 refills | Status: DC

## 2017-05-18 NOTE — Telephone Encounter (Signed)
Yes. Ok to prescribe trelegy instead of breo and incuse Ok to prescribe albuterol as needed  Chilton Greathouse MD [facility] 05/18/2017, 9:31 AM

## 2017-05-18 NOTE — Telephone Encounter (Signed)
Spoke with and notified of recs per PM  She verbalized understanding  Rxs were sent and MAR updated

## 2017-05-25 ENCOUNTER — Telehealth: Payer: Self-pay | Admitting: Pulmonary Disease

## 2017-05-25 NOTE — Telephone Encounter (Signed)
PA request received from [facility] pharmacy on [address] Attempted to initiate PA via [email or web address], pt has Medicaid so must be done via [employer] at [phone] . PA has been approved X1 year- PA#: [id number] [facility] pharmacy aware.  Nothing further needed.

## 2017-05-26 ENCOUNTER — Ambulatory Visit: Payer: Self-pay

## 2017-05-27 ENCOUNTER — Ambulatory Visit (INDEPENDENT_AMBULATORY_CARE_PROVIDER_SITE_OTHER): Payer: Medicaid Other

## 2017-05-27 ENCOUNTER — Ambulatory Visit: Payer: Self-pay

## 2017-05-27 DIAGNOSIS — L501 Idiopathic urticaria: Secondary | ICD-10-CM | POA: Diagnosis not present

## 2017-06-13 ENCOUNTER — Telehealth: Payer: Self-pay

## 2017-06-13 MED ORDER — RANITIDINE HCL 150 MG PO TABS: 150.0000 mg | ORAL_TABLET | Freq: Two times a day (BID) | ORAL | 2 refills | Status: DC

## 2017-06-13 MED ORDER — MONTELUKAST SODIUM 10 MG PO TABS: 10.0000 mg | ORAL_TABLET | Freq: Every day | ORAL | 2 refills | Status: DC

## 2017-06-13 MED ORDER — CETIRIZINE HCL 10 MG PO TABS: ORAL_TABLET | ORAL | 2 refills | Status: DC

## 2017-06-13 MED ORDER — PATADAY 0.2 % OP SOLN: OPHTHALMIC | 2 refills | Status: DC

## 2017-06-13 NOTE — Telephone Encounter (Signed)
Patient is calling because her prescriptions were to be sent to [facility]. She didn't receive any of her prescriptions.   Please Advise

## 2017-06-13 NOTE — Telephone Encounter (Signed)
I spoke with patient and she advised that Patanol, cetirizine, ranitidine and montelukast all needed to be changed to a 90 day supply and sent to [facility]. I have done that as requested.

## 2017-06-13 NOTE — Telephone Encounter (Signed)
Patient states it is to be a 90-day supply.

## 2017-06-24 ENCOUNTER — Ambulatory Visit: Payer: Self-pay

## 2017-06-27 ENCOUNTER — Ambulatory Visit (INDEPENDENT_AMBULATORY_CARE_PROVIDER_SITE_OTHER): Payer: Medicaid Other

## 2017-06-27 DIAGNOSIS — L500 Allergic urticaria: Secondary | ICD-10-CM

## 2017-06-27 MED ORDER — OMALIZUMAB 150 MG ~~LOC~~ SOLR
300.0000 mg | SUBCUTANEOUS | Status: AC
  Administered 2017-06-27 – 2017-11-18 (×4): 300 mg via SUBCUTANEOUS

## 2017-06-29 ENCOUNTER — Other Ambulatory Visit: Payer: Self-pay | Admitting: *Deleted

## 2017-06-29 ENCOUNTER — Telehealth: Payer: Self-pay

## 2017-06-29 MED ORDER — PATADAY 0.2 % OP SOLN: OPHTHALMIC | 2 refills | Status: DC

## 2017-06-29 MED ORDER — RANITIDINE HCL 150 MG PO TABS: 150.0000 mg | ORAL_TABLET | Freq: Two times a day (BID) | ORAL | 2 refills | Status: DC

## 2017-06-29 MED ORDER — CETIRIZINE HCL 10 MG PO TABS: ORAL_TABLET | ORAL | 2 refills | Status: DC

## 2017-06-29 MED ORDER — MONTELUKAST SODIUM 10 MG PO TABS: 10.0000 mg | ORAL_TABLET | Freq: Every day | ORAL | 2 refills | Status: DC

## 2017-06-29 NOTE — Telephone Encounter (Signed)
Patient is calling stating that her prescriptions were not received by [facility].  Please Advise

## 2017-06-29 NOTE — Telephone Encounter (Signed)
Prescriptions have been sent into [facility]. I called and left a voicemail for the patient letting them know that they have been sent in.

## 2017-07-25 ENCOUNTER — Ambulatory Visit: Payer: Medicaid Other

## 2017-08-05 ENCOUNTER — Ambulatory Visit (INDEPENDENT_AMBULATORY_CARE_PROVIDER_SITE_OTHER): Payer: Medicaid Other | Admitting: *Deleted

## 2017-08-05 DIAGNOSIS — L500 Allergic urticaria: Secondary | ICD-10-CM

## 2017-08-05 DIAGNOSIS — J4540 Moderate persistent asthma, uncomplicated: Secondary | ICD-10-CM

## 2017-09-02 ENCOUNTER — Ambulatory Visit (INDEPENDENT_AMBULATORY_CARE_PROVIDER_SITE_OTHER): Payer: Medicaid Other

## 2017-09-02 DIAGNOSIS — L500 Allergic urticaria: Secondary | ICD-10-CM

## 2017-09-02 DIAGNOSIS — L501 Idiopathic urticaria: Secondary | ICD-10-CM

## 2017-09-29 ENCOUNTER — Ambulatory Visit: Payer: Self-pay

## 2017-10-11 ENCOUNTER — Other Ambulatory Visit: Payer: Self-pay | Admitting: Pulmonary Disease

## 2017-11-15 ENCOUNTER — Ambulatory Visit: Payer: Medicaid Other | Admitting: Allergy and Immunology

## 2017-11-15 ENCOUNTER — Encounter: Payer: Self-pay | Admitting: Allergy and Immunology

## 2017-11-15 VITALS — BP 136/80 | HR 64 | Resp 16 | Ht 65.0 in | Wt 271.8 lb

## 2017-11-15 DIAGNOSIS — L501 Idiopathic urticaria: Secondary | ICD-10-CM | POA: Diagnosis not present

## 2017-11-15 NOTE — Patient Instructions (Signed)
   1.  Continue Xolair and EpiPen  2. Continue cetirizine 10 mg twice a day and ranitidine 150 mg twice a day and montelukast 10mg  one time per day  3. Can use Patanol one drop each eye 2 times per day if needed  4.  Can use hydroxyzine 25 mg tablet at bedtime if needed  5. Return to clinic in 6 months or earlier if problem

## 2017-11-15 NOTE — Progress Notes (Signed)
Follow-up Note  Referring Provider: Amelia Jo, FNP Primary Provider: Amelia Jo, FNP Date of Office Visit: 11/15/2017  Subjective:   Amber Scott (DOB: 11-13-1955) is a 62 y.o. female who [facility]2017 in re-evaluation of the following:  HPI: Amber Scott returns to this clinic in reevaluation of chronic urticaria treated with omalizumab.  Her last visit to this clinic was 17 May 2017 at which point time she restarted omalizumab injections.  Once again she has had a very good response to omalizumab and has resolved all of her urticaria.  She still has some occasional itchiness at nighttime but while consistently using cetirizine and ranitidine and montelukast in addition to omalizumab she believes that she is doing relatively well.  Allergies as of 11/15/2017   No Known Allergies     Medication List      albuterol (2.5 MG/3ML) 0.083% nebulizer solution Commonly known as:  PROVENTIL Take 2.5 mg by nebulization every 6 (six) hours as needed for wheezing or shortness of breath.   albuterol 108 (90 Base) MCG/ACT inhaler Commonly known as:  PROVENTIL HFA;VENTOLIN HFA Inhale 2 puffs into the lungs every 6 (six) hours as needed for wheezing or shortness of breath.   amLODipine 5 MG tablet Commonly known as:  NORVASC Take by mouth.   aspirin 81 MG tablet Take 81 mg by mouth daily.   atorvastatin 10 MG tablet Commonly known as:  LIPITOR Take 10 mg by mouth at bedtime.   CELEBREX 200 MG capsule Generic drug:  celecoxib Take 200 mg by mouth 2 (two) times daily.   cetirizine 10 MG tablet Commonly known as:  ZYRTEC Take one tablet by mouth once daily as directed.   ENSURE Take 237 mLs by mouth 2 (two) times daily.   EPINEPHrine 0.3 mg/0.3 mL Soaj injection Commonly known as:  EPI-PEN USE AS DIRECTED FOR LIFE-THREATENING ALLERGIC REACTIONS   gabapentin 600 MG tablet Commonly known as:  NEURONTIN TAKE 1 Tablet BY  MOUTH 4 TIMES DAILY   hydrOXYzine 25 MG tablet Commonly known as:  ATARAX/VISTARIL TAKE 1 TABLET BY MOUTH ONCE TO TWICE A DAY IF NEEDED   montelukast 10 MG tablet Commonly known as:  SINGULAIR Take 1 tablet (10 mg total) by mouth daily.   omeprazole 20 MG capsule Commonly known as:  PRILOSEC Take 20 mg by mouth.   PATADAY 0.2 % Soln Generic drug:  Olopatadine HCl Can use one drop in each eye once daily if needed.   ranitidine 150 MG tablet Commonly known as:  ZANTAC Take 1 tablet (150 mg total) by mouth 2 (two) times daily.   sertraline 50 MG tablet Commonly known as:  ZOLOFT Take 50 mg by mouth daily.   tiZANidine 4 MG tablet Commonly known as:  ZANAFLEX   traMADol 50 MG tablet Commonly known as:  ULTRAM Take 100 mg by mouth at bedtime.   TRELEGY ELLIPTA 100-62.5-25 MCG/INH Aepb Generic drug:  Fluticasone-Umeclidin-Vilant INHALE 1 PUFF INTO THE LUNGS DAILY       Past Medical History:  Diagnosis Date  . History of tobacco abuse   . Hypertension   . Urticaria     Past Surgical History:  Procedure Laterality Date  . CHOLECYSTECTOMY    . TOTAL HIP ARTHROPLASTY    . VESICOVAGINAL FISTULA CLOSURE W/ TAH    . VIDEO BRONCHOSCOPY Bilateral 04/10/2013   Procedure: VIDEO BRONCHOSCOPY WITH FLUORO;  Surgeon: Storm Frisk, MD;  Location: [facility];  Service: Cardiopulmonary;  Laterality: Bilateral;    Review of systems negative except as noted in HPI / PMHx or noted below:  Review of Systems  Constitutional: Negative.   HENT: Negative.   Eyes: Negative.   Respiratory: Negative.   Cardiovascular: Negative.   Gastrointestinal: Negative.   Genitourinary: Negative.   Musculoskeletal: Negative.   Skin: Negative.   Neurological: Negative.   Endo/Heme/Allergies: Negative.   Psychiatric/Behavioral: Negative.      Objective:   Vitals:   11/15/17 1455  BP: 136/80  Pulse: 64  Resp: 16   Height: 5\' 5"  (165.1 cm)  Weight: 271 lb 12.8 oz (123.3 kg)    Physical Exam  HENT:  Head: Normocephalic.  Right Ear: Tympanic membrane, external ear and ear canal normal.  Left Ear: Tympanic membrane, external ear and ear canal normal.  Nose: Nose normal. No mucosal edema or rhinorrhea.  Mouth/Throat: Uvula is midline, oropharynx is clear and moist and mucous membranes are normal. No oropharyngeal exudate.  Eyes: Conjunctivae are normal.  Neck: Trachea normal. No tracheal tenderness present. No tracheal deviation present. No thyromegaly present.  Cardiovascular: Normal rate, regular rhythm, S1 normal, S2 normal and normal heart sounds.  No murmur heard. Pulmonary/Chest: Breath sounds normal. No stridor. No respiratory distress. She has no wheezes. She has no rales.  Musculoskeletal: She exhibits no edema.  Lymphadenopathy:       Head (right side): No tonsillar adenopathy present.       Head (left side): No tonsillar adenopathy present.    She has no cervical adenopathy.  Neurological: She is alert.  Skin: No rash noted. She is not diaphoretic. No erythema. Nails show no clubbing.    Diagnostics: None  Assessment and Plan:   1. Idiopathic urticaria     1.  Continue Xolair and EpiPen  2. Continue cetirizine 10 mg twice a day and ranitidine 150 mg twice a day and montelukast 10mg  one time per day  3. Can use Patanol one drop each eye 2 times per day if needed  4.  Can use hydroxyzine 25 mg tablet at bedtime if needed  5. Return to clinic in 6 months or earlier if problem   Amber Scott appears to be doing very well and she will continue on omalizumab as well as other medications as noted above to address her idiopathic urticaria and I will see her back in this clinic in 6 months or earlier if there is a problem.  Amber Schimke, MD Allergy / Immunology [facility]

## 2017-11-16 ENCOUNTER — Encounter: Payer: Self-pay | Admitting: Allergy and Immunology

## 2017-11-18 ENCOUNTER — Ambulatory Visit (INDEPENDENT_AMBULATORY_CARE_PROVIDER_SITE_OTHER): Payer: Medicaid Other

## 2017-11-18 ENCOUNTER — Telehealth: Payer: Self-pay | Admitting: Allergy and Immunology

## 2017-11-18 DIAGNOSIS — L500 Allergic urticaria: Secondary | ICD-10-CM | POA: Diagnosis not present

## 2017-11-18 MED ORDER — CETIRIZINE HCL 10 MG PO TABS: ORAL_TABLET | ORAL | 1 refills | Status: DC

## 2017-11-18 MED ORDER — HYDROXYZINE HCL 25 MG PO TABS: 25.0000 mg | ORAL_TABLET | Freq: Two times a day (BID) | ORAL | 3 refills | Status: DC | PRN

## 2017-11-18 MED ORDER — RANITIDINE HCL 150 MG PO TABS: 150.0000 mg | ORAL_TABLET | Freq: Two times a day (BID) | ORAL | 2 refills | Status: DC

## 2017-11-18 MED ORDER — MONTELUKAST SODIUM 10 MG PO TABS: 10.0000 mg | ORAL_TABLET | Freq: Every day | ORAL | 1 refills | Status: DC

## 2017-11-18 NOTE — Telephone Encounter (Signed)
Prescriptions have been sent in as requested.  

## 2017-11-18 NOTE — Telephone Encounter (Signed)
Called and left message informing patient that the medication she requested was sent in to the pharmacy she requested.

## 2017-11-18 NOTE — Telephone Encounter (Signed)
Patient needs refills on cetrizine,montelukast,hydroxizine,ranitidine called into [facility]

## 2017-12-12 ENCOUNTER — Other Ambulatory Visit: Payer: Self-pay | Admitting: *Deleted

## 2017-12-12 MED ORDER — PATADAY 0.2 % OP SOLN: OPHTHALMIC | 5 refills | Status: DC

## 2017-12-13 ENCOUNTER — Ambulatory Visit: Payer: Self-pay

## 2018-01-09 ENCOUNTER — Other Ambulatory Visit: Payer: Self-pay | Admitting: Pulmonary Disease

## 2018-02-10 ENCOUNTER — Other Ambulatory Visit: Payer: Self-pay | Admitting: Pulmonary Disease

## 2018-02-17 ENCOUNTER — Ambulatory Visit (HOSPITAL_BASED_OUTPATIENT_CLINIC_OR_DEPARTMENT_OTHER): Payer: Medicaid Other

## 2018-02-21 ENCOUNTER — Ambulatory Visit (HOSPITAL_BASED_OUTPATIENT_CLINIC_OR_DEPARTMENT_OTHER): Admission: RE | Admit: 2018-02-21 | Payer: Medicaid Other | Source: Ambulatory Visit

## 2018-03-14 ENCOUNTER — Other Ambulatory Visit: Payer: Self-pay | Admitting: Allergy and Immunology

## 2018-05-18 ENCOUNTER — Ambulatory Visit (INDEPENDENT_AMBULATORY_CARE_PROVIDER_SITE_OTHER): Payer: Medicare Other | Admitting: Allergy and Immunology

## 2018-05-18 ENCOUNTER — Encounter: Payer: Self-pay | Admitting: Allergy and Immunology

## 2018-05-18 ENCOUNTER — Other Ambulatory Visit: Payer: Self-pay

## 2018-05-18 DIAGNOSIS — J449 Chronic obstructive pulmonary disease, unspecified: Secondary | ICD-10-CM | POA: Diagnosis not present

## 2018-05-18 DIAGNOSIS — L501 Idiopathic urticaria: Secondary | ICD-10-CM | POA: Diagnosis not present

## 2018-05-18 DIAGNOSIS — H1010 Acute atopic conjunctivitis, unspecified eye: Secondary | ICD-10-CM | POA: Diagnosis not present

## 2018-05-18 MED ORDER — OLOPATADINE HCL 0.2 % OP SOLN: OPHTHALMIC | 5 refills | Status: DC

## 2018-05-18 MED ORDER — FLUTICASONE-UMECLIDIN-VILANT 100-62.5-25 MCG/INH IN AEPB: 1.0000 | INHALATION_SPRAY | Freq: Every day | RESPIRATORY_TRACT | 5 refills | Status: DC

## 2018-05-18 MED ORDER — ALBUTEROL SULFATE HFA 108 (90 BASE) MCG/ACT IN AERS: INHALATION_SPRAY | RESPIRATORY_TRACT | 1 refills | Status: DC

## 2018-05-18 MED ORDER — ALBUTEROL SULFATE (2.5 MG/3ML) 0.083% IN NEBU: INHALATION_SOLUTION | RESPIRATORY_TRACT | 0 refills | Status: DC

## 2018-05-18 MED ORDER — HYDROXYZINE HCL 25 MG PO TABS: ORAL_TABLET | ORAL | 5 refills | Status: DC

## 2018-05-18 MED ORDER — FAMOTIDINE 20 MG PO TABS: 20.0000 mg | ORAL_TABLET | Freq: Two times a day (BID) | ORAL | 5 refills | Status: DC

## 2018-05-18 MED ORDER — CETIRIZINE HCL 10 MG PO TABS: ORAL_TABLET | ORAL | 5 refills | Status: DC

## 2018-05-18 MED ORDER — MONTELUKAST SODIUM 10 MG PO TABS: 10.0000 mg | ORAL_TABLET | Freq: Every day | ORAL | 5 refills | Status: DC

## 2018-05-18 NOTE — Patient Instructions (Addendum)
   1.  Continue Xolair and EpiPen  2. Continue cetirizine 10 mg twice a day  3. Start Famotidine 20 mg twice a day (ranitidine replacement)  4. Continue montelukast 10mg  one time per day  5. Can use Pataday one drop each eye 1 time per day if needed  6.  Can use hydroxyzine 25 mg tablet at bedtime if needed  7. Refill Trelegy - one inhalation 1 time per day  8. Refill albuterol HFA  and albuterol nebulization  9. Return to clinic in 6 months or earlier if problem
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
## 2018-05-22 ENCOUNTER — Encounter: Payer: Self-pay | Admitting: Allergy and Immunology

## 2018-06-01 NOTE — Progress Notes (Signed)
Patient is at home.  Provider is in the office.  Start time: 11:02 am End time: 11:29 am Verbal consent given to file insurance.

## 2018-09-07 ENCOUNTER — Other Ambulatory Visit: Payer: Self-pay

## 2018-09-07 MED ORDER — ALBUTEROL SULFATE HFA 108 (90 BASE) MCG/ACT IN AERS: INHALATION_SPRAY | RESPIRATORY_TRACT | 1 refills | Status: DC

## 2018-11-02 ENCOUNTER — Other Ambulatory Visit: Payer: Self-pay

## 2018-11-02 MED ORDER — MONTELUKAST SODIUM 10 MG PO TABS: 10.0000 mg | ORAL_TABLET | Freq: Every day | ORAL | 0 refills | Status: DC

## 2018-11-02 MED ORDER — CETIRIZINE HCL 10 MG PO TABS: ORAL_TABLET | ORAL | 0 refills | Status: DC

## 2018-11-21 ENCOUNTER — Ambulatory Visit: Payer: Medicare Other | Admitting: Allergy and Immunology

## 2018-11-22 ENCOUNTER — Ambulatory Visit (INDEPENDENT_AMBULATORY_CARE_PROVIDER_SITE_OTHER): Payer: Medicare HMO | Admitting: Allergy and Immunology

## 2018-11-22 ENCOUNTER — Encounter: Payer: Self-pay | Admitting: Allergy and Immunology

## 2018-11-22 ENCOUNTER — Other Ambulatory Visit: Payer: Self-pay

## 2018-11-22 DIAGNOSIS — H1010 Acute atopic conjunctivitis, unspecified eye: Secondary | ICD-10-CM | POA: Diagnosis not present

## 2018-11-22 DIAGNOSIS — J449 Chronic obstructive pulmonary disease, unspecified: Secondary | ICD-10-CM | POA: Diagnosis not present

## 2018-11-22 DIAGNOSIS — L501 Idiopathic urticaria: Secondary | ICD-10-CM | POA: Diagnosis not present

## 2018-11-22 MED ORDER — ALBUTEROL SULFATE (2.5 MG/3ML) 0.083% IN NEBU: INHALATION_SOLUTION | RESPIRATORY_TRACT | 1 refills | Status: AC

## 2018-11-22 MED ORDER — ALBUTEROL SULFATE HFA 108 (90 BASE) MCG/ACT IN AERS: INHALATION_SPRAY | RESPIRATORY_TRACT | 1 refills | Status: DC

## 2018-11-22 NOTE — Patient Instructions (Signed)
   1.  Continue Xolair and EpiPen  2. Continue cetirizine 10 mg 1-2 tablets twice a day  3. Continue Famotidine 20 mg twice a day    4. Continue montelukast 10mg  one time per day  5. Can use Pataday one drop each eye 1 time per day if needed  6.  Can use hydroxyzine 25 mg tablet at bedtime if needed  7. Continue Trelegy - one inhalation 1 time per day  8. Refill albuterol HFA  and albuterol nebulization to be used if needed  9. Return to clinic in 6 months or earlier if problem

## 2018-11-22 NOTE — Progress Notes (Signed)
[place] [facility] [place]   Follow-up Note  Referring Provider: Delilah Shan, MD Primary Provider: Delilah Shan, MD Date of Office Visit: 11/22/2018  Subjective:   Amber Scott (DOB: 07-Mar-1955) is a 63 y.o. female who returns to the [facility] on 11/22/2018 in re-evaluation of the following:  HPI: This is a E-med visit requested by patient who is located at home.  She is followed in this clinic for chronic urticaria treated with omalizumab.  Her last interaction with this clinic was on E - med visit on 18 May 2018.  Last injection of omalizumab 18 November 2017.  More activity of urticaria with a pressure component.   Using cetirizine 10 mg two times per day + famotidine 20 mg 2 times per day.  Not required any steroids for control.   Lungs good. Use Trelegy daily. Rare use of SABA. No requirement for systemic steroid. Followed by Dr. Vaughan Browner.  Did have flu vaccine.  Allergies as of 11/22/2018   No Known Allergies     Medication List      albuterol (2.5 MG/3ML) 0.083% nebulizer solution Commonly known as: PROVENTIL Can use one vial in nebulizer every four to six hours as needed for cough or wheeze.   albuterol 108 (90 Base) MCG/ACT inhaler Commonly known as: ProAir HFA Inhale two puffs every four to six hours as needed for cough or wheeze.   amLODipine 10 MG tablet Commonly known as: NORVASC   aspirin EC 325 MG tablet TAKE ONE TABLET BY MOUTH DAILY   atorvastatin 40 MG tablet Commonly known as: LIPITOR Take 40 mg by mouth.   Buprenorphine 15 MCG/HR Ptwk APPLY ONE PATCH ONTO THE SKIN EVERY SEVEN DAYS, REMOVE OLD PATCH BEFORE APPLYING NEW   CeleBREX 200 MG capsule Generic drug: celecoxib Take 200 mg by mouth 2 (two) times daily.   cetirizine 10 MG tablet Commonly known as: ZYRTEC Take one tablet by mouth twice daily as directed.   EPINEPHrine 0.3 mg/0.3 mL Soaj injection Commonly known as: EpiPen  2-Pak USE AS DIRECTED FOR LIFE-THREATENING ALLERGIC REACTIONS   famotidine 20 MG tablet Commonly known as: PEPCID Take 1 tablet (20 mg total) by mouth 2 (two) times daily.   Fluticasone-Umeclidin-Vilant 100-62.5-25 MCG/INH Aepb Commonly known as: Trelegy Ellipta Inhale 1 puff into the lungs daily. Rinse, gargle, and spit after use.   gabapentin 600 MG tablet Commonly known as: NEURONTIN TAKE 1 Tablet BY MOUTH 4 TIMES DAILY   hydrOXYzine 25 MG tablet Commonly known as: ATARAX/VISTARIL Can take one tablet by mouth at bedtime if needed.   montelukast 10 MG tablet Commonly known as: SINGULAIR Take 1 tablet (10 mg total) by mouth daily.   Olopatadine HCl 0.2 % Soln Commonly known as: Pataday Can use one drop in each eye once daily if needed.   omeprazole 20 MG capsule Commonly known as: PRILOSEC Take 20 mg by mouth.   sertraline 50 MG tablet Commonly known as: ZOLOFT Take 50 mg by mouth daily.       Past Medical History:  Diagnosis Date  . History of tobacco abuse   . Hypertension   . Urticaria     Past Surgical History:  Procedure Laterality Date  . CHOLECYSTECTOMY    . TOTAL HIP ARTHROPLASTY    . VESICOVAGINAL FISTULA CLOSURE W/ TAH    . VIDEO BRONCHOSCOPY Bilateral 04/10/2013   Procedure: VIDEO BRONCHOSCOPY WITH FLUORO;  Surgeon: Elsie Stain, MD;  Location: [facility];  Service: Cardiopulmonary;  Laterality: Bilateral;    Review of systems negative except as noted in HPI / PMHx or noted below:  Review of Systems  Constitutional: Negative.   HENT: Negative.   Eyes: Negative.   Respiratory: Negative.   Cardiovascular: Negative.   Gastrointestinal: Negative.   Genitourinary: Negative.   Musculoskeletal: Negative.   Skin: Negative.   Neurological: Negative.   Endo/Heme/Allergies: Negative.   Psychiatric/Behavioral: Negative.      Objective:   There were no vitals filed for this visit.        Physical Exam-deferred  Diagnostics: None    Assessment and Plan:   1. Idiopathic urticaria   2. COPD with asthma (HCC)   3. Seasonal allergic conjunctivitis     1.  Continue Xolair and EpiPen  2. Continue cetirizine 10 mg 1-2 tablets twice a day  3. Continue Famotidine 20 mg twice a day    4. Continue montelukast 10mg  one time per day  5. Can use Pataday one drop each eye 1 time per day if needed  6.  Can use hydroxyzine 25 mg tablet at bedtime if needed  7. Continue Trelegy - one inhalation 1 time per day  8. Refill albuterol HFA  and albuterol nebulization to be used if needed  9. Return to clinic in 6 months or earlier if problem   It sounds as though Sydell is doing relatively well at this point in time on her current plan.  She has the option of increasing her cetirizine if she loses control of her urticaria.  She will remain on famotidine and montelukast as well.  Her lung issue appears to be going OK.  She wants me to once again refill her trelegy as she has not had an opportunity to see her pulmonologist in [place].  I will refill that medication along with her SABA and we will see her back in this clinic in 6 months or earlier if there is a problem.  [place], MD Allergy / Immunology [facility]

## 2018-11-22 NOTE — Progress Notes (Signed)
Telephone Visit. Patient is at home. Provider is in the office. Start Time: 4:09 p.m. End Time: 4:25 p.m. Verbal consent was given to file insurance.

## 2018-11-23 ENCOUNTER — Encounter: Payer: Self-pay | Admitting: Allergy and Immunology

## 2018-11-29 ENCOUNTER — Other Ambulatory Visit: Payer: Self-pay | Admitting: *Deleted

## 2018-11-29 MED ORDER — HYDROXYZINE HCL 25 MG PO TABS: ORAL_TABLET | ORAL | 5 refills | Status: AC

## 2018-12-01 ENCOUNTER — Other Ambulatory Visit: Payer: Self-pay

## 2018-12-01 MED ORDER — CETIRIZINE HCL 10 MG PO TABS: ORAL_TABLET | ORAL | 5 refills | Status: AC

## 2018-12-01 MED ORDER — TRELEGY ELLIPTA 100-62.5-25 MCG/INH IN AEPB: 1.0000 | INHALATION_SPRAY | Freq: Every day | RESPIRATORY_TRACT | 5 refills | Status: AC

## 2019-01-08 ENCOUNTER — Other Ambulatory Visit: Payer: Self-pay | Admitting: *Deleted

## 2019-01-08 MED ORDER — MONTELUKAST SODIUM 10 MG PO TABS: 10.0000 mg | ORAL_TABLET | Freq: Every day | ORAL | 4 refills | Status: DC

## 2019-01-08 MED ORDER — OLOPATADINE HCL 0.2 % OP SOLN: OPHTHALMIC | 4 refills | Status: DC

## 2019-04-26 ENCOUNTER — Other Ambulatory Visit: Payer: Self-pay | Admitting: *Deleted

## 2019-04-26 MED ORDER — ALBUTEROL SULFATE HFA 108 (90 BASE) MCG/ACT IN AERS: INHALATION_SPRAY | RESPIRATORY_TRACT | 1 refills | Status: DC

## 2019-04-26 MED ORDER — MONTELUKAST SODIUM 10 MG PO TABS: ORAL_TABLET | ORAL | 0 refills | Status: DC

## 2019-05-29 ENCOUNTER — Other Ambulatory Visit: Payer: Self-pay | Admitting: *Deleted

## 2019-05-29 MED ORDER — FAMOTIDINE 20 MG PO TABS: ORAL_TABLET | ORAL | 0 refills | Status: AC

## 2019-06-05 ENCOUNTER — Other Ambulatory Visit: Payer: Self-pay | Admitting: *Deleted

## 2019-06-06 ENCOUNTER — Other Ambulatory Visit: Payer: Self-pay | Admitting: *Deleted

## 2019-06-06 MED ORDER — ALBUTEROL SULFATE HFA 108 (90 BASE) MCG/ACT IN AERS: INHALATION_SPRAY | RESPIRATORY_TRACT | 1 refills | Status: AC

## 2019-06-06 MED ORDER — OLOPATADINE HCL 0.2 % OP SOLN: OPHTHALMIC | 1 refills | Status: AC

## 2019-06-06 MED ORDER — MONTELUKAST SODIUM 10 MG PO TABS: ORAL_TABLET | ORAL | 1 refills | Status: AC

## 2019-06-08 ENCOUNTER — Other Ambulatory Visit: Payer: Self-pay | Admitting: *Deleted

## 2019-06-15 ENCOUNTER — Other Ambulatory Visit: Payer: Self-pay

## 2020-01-07 ENCOUNTER — Other Ambulatory Visit: Payer: Self-pay | Admitting: Allergy and Immunology

## 2020-04-22 ENCOUNTER — Other Ambulatory Visit: Payer: Self-pay | Admitting: Allergy and Immunology

## 2023-12-21 NOTE — ED Provider Notes (Signed)
 ------------------------------------------------------------------------------- Attestation signed by Lynwood Ladene Woodrow DOUGLAS, MD at 12/25/2023 10:28 AM _______________________________________________________________________ ATTENDING SUPERVISORY NOTE I have personally seen and examined the patient, and discussed the plan of care with the APP.   I have reviewed the documentation of the APP and agree.   Patient's presentation is most consistent with acute presentation with potential threat to life or bodily function  Shared service with advanced practice provider.  I have personally seen and examined the patient, presenting with a complaint of fall.  Physical exam findings include patient is alert.  Plan is to order trauma scans. -------------------------------------------------------------------------------            Chief Complaint  Patient presents with   Fall       HPI Amber Scott is a 68 y.o. female with PMHx of  68 y.o. female with PMHx sig for HFpEF, Lewy body dementia, HTN, COPD who presented to the emergency department as a level 2 trauma due to fall.  Last seen by family yesterday evening.  This morning they found her down in the bathroom.  Unknown downtime.  Patient wears 5 to 6 L nasal cannula O2 at baseline.  Endorses increased cough and sputum production recently.  Reports pain in her left hip.  Hypotensive en route with EMS received a liter of fluids.   History provided by:  Medical records, patient and EMS personnel Language interpreter used: No       Patient History Medical History[1] Surgical History[2] Family History[3] Social History[4]    Review of Systems Review of Systems  Unable to perform ROS: Acuity of condition      Physical Exam ED Triage Vitals  Temp 12/21/23 1026 99.4 F (37.4 C)  Heart Rate 12/21/23 1025 98  Resp 12/21/23 1025 16  BP 12/21/23 1025 (!) 150/124  MAP (mmHg) 12/21/23 1025 133  SpO2 12/21/23 1025 91 %  O2 Device  12/21/23 1038 Medium flow nasal cannula  O2 Flow Rate (L/min) 12/21/23 1038 9 L/min  Weight 12/21/23 1142 96.2 kg (212 lb)   Physical Exam     CHA2DS2-VASc Score: N/A  Glasgow Coma Scale Score: 13                  Procedures                       ED Course & MDM ED Course as of 12/23/23 1116  Wed Dec 21, 2023  1223 S - Hx COPD, resent respiratory failure. Found down with LKN yesterday evening. 5-6L at baseline. Lvl 1 due to hypotension.  [DZ]  1818 S- admit to [facility] for sepsis of unknown etiology. Baseline 5-6LO2, GLF, L2 then 1 trauma, got 1 unit blood. Aortic thrombus on CT, family okay not intervening bc she is on hospice. On heparin -got abx, IVF, levo never started -full code, wants to make it to christmas [HJ]  2312 Stable 22F admitted to [facility] for sepsis unknwn etiology Fluids, abx Unclear source Initally level 2 then got 1 unit blood, aortic thrombus, getting heparin, not goals of care to get surgery but is full code [JL]    ED Course User Index [DZ] Toribio Elodie Mana, MD [HJ] Onita Donavon Mace, MD [JL] Fairy Kerby Revere, MD   Medical Decision Making Problems Addressed: Fall, initial encounter: complicated acute illness or injury  Amount and/or Complexity of Data Reviewed Labs: ordered. Radiology: ordered. ECG/medicine tests: ordered.  Risk OTC drugs. Prescription drug management. Decision regarding hospitalization.  Physical Exam    @EDTRIAGEVITALSNEW @  Primary Survey: A: Intact B: Bilateral breath sounds diminished  C: HR Heart Rate: 82; BP BP: (!) 176/93 GCS: 14 (E3, V5, M6) Immediate interventions: C-collar placed, IV access  Secondary Survey:  Constitutional No acute distress   Head No obvious trauma No skull depressions or lacerations  ENT PERRL No conjunctival hemorrhage No periorbital ecchymoses or postauricalar ecchymoses  Ears atraumatic No nasal septal deviation or hematoma Mouth and tongue atraumatic Trachea midline.   Neck  No C spine stepoffs, deformities, or tenderness C collar in place  Chest Clavicles atraumatic Clavicles stable to anterior compression without crepitus Chest wall with symmetric expansion Chest wall stable to anterior and lateral compression without crepitus  Respiratory No tachypnea noted Breath sounds diminished throughout.  Placed on high flow nasal cannula to maintain sat greater than 91%.  CV Normal rate DP and radial pulses 2+ and equal bilaterally  Abdomen Soft Non-tender Non-distended No peritonitis No abrasions/contusions  GU Atraumatic No gross blood  MSK Atraumatic Ecchymosis noted to the right hip ROM appropriate Pelvis stable to lateral compression  Back T spine non-tender L spine non-tender No step offs or deformities   Skin Warm Dry  Neuro Awake and alert Moving all extremities GCS 14     ED Course & MDM    @PROCEDURE @  Amber Scott is a 68 y.o. female with PMHx of  68 y.o. female with PMHx sig for HFpEF, Lewy body dementia, HTN, COPD who presented to the emergency department as a level 2 trauma due to fall.  Last seen by family yesterday evening.  This morning they found her down in the bathroom.  Unknown downtime.  Patient wears 5 to 6 L nasal cannula O2 at baseline.  Endorses increased cough and sputum production recently.  Reports pain in her left hip.  Hypotensive en route with EMS received a liter of fluids.  Prior to arrival of the patient, the room was prepared with the following: code cart to bedside, glidescope, suction x1, BVM. Trauma team was present prior to arrival of the patient.    Upon arrival of the patient, EMS provided pertinent history and exam findings. The patient was transferred over to the trauma bed. ABCs as above.  Patient found to be hypotensive, 1 unit of emergency release blood given and patient upgraded to level 1 trauma. Breath sounds diminished throughout.  Placed on high flow nasal cannula to maintain sat greater than 91%.  Once 2 IVs were placed, the secondary exam was performed. I performed the secondary exam from the head to the neck, and the resident on the trauma team performed the secondary exam from the neck down, and findings are noted above. Pertinent physical exam findings include tenderness and bruising to the right hip.. Portable XRs performed at the bedside. eFAST exam performed. The patient was then prepared and sent to the CT for trauma scans and dedicated x-rays of the lower extremities/hips  Differential diagnosis includes musculoskeletal fracture, skull fracture, spinal cord damage, neurovascular injury, intracranial bleed/abnormality, intrathoracic injury (including pneumothorax, hemothorax, rib fracture, blunt cardiac injury), intraabdominal injury, among others.   Also considered infectious etiologies including sepsis, UTI, pneumonia, viral illness COVID/flu.  As patient was found down CK added to workup today.  Workup:  Labs: CBC no leukocytosis.  Hemoglobin hematocrit improved from previous without worsening anemia, CMP significant for AKI, coags within normal limits, lactate elevated Plan 4-hour to recheck, ethanol <10.  COVID and influenza negative.  Trauma imaging revealed  in brief (full reports in EMR):  Portable CXR:  Diffuse interstitial thickening could reflect pulmonary contusion in the setting of trauma versus mild pulmonary edema. Attention on forthcoming chest abdomen pelvis CT   Portable Pelvis:    No acute fracture. No dislocation. Bilateral total hip arthroplasties, partially imaged. No complication of the imaged hardware. Mild degenerative changes of the SI joints and pubic symphysis. Vascular calcifications.  CT imaging:  CT head on my independent review as well as review by radiology demonstrates no acute intracranial injury.  CT C/T/L-spine in process, CT CAP in process.   Plain films in process.   Interventions:  The patient received the following while in the  ED. Medications  heparin infusion 25,000 units/250 mL in 0.45% NS (100 units/mL) premix (26 Units/kg/hr  96.2 kg intravenous New Bag 12/23/23 0959)  heparin bolus from infusion 100 units/mL (7,700 Units intravenous Bolus from Infusion 12/23/23 0949)  albuterol  HFA (PROVENTIL  HFA;VENTOLIN  HFA;PROAIR  HFA) 90 mcg/actuation inhaler 2 puff (2 puffs inhalation Given 12/22/23 1207)  ipratropium-albuteroL  (DUO-NEB) 0.5-2.5 mg/3 mL nebulizer solution 3 mL (has no administration in time range)  dextrose (D50W) 50 % injection 12.5 g (has no administration in time range)  dextrose (TRUEPLUS GLUCOSE) 15 gram/32 mL oral gel 15 g (has no administration in time range)  melatonin tablet 1 mg (has no administration in time range)  piperacillin-tazobactam (ZOSYN) 3.375 g in sodium chloride  0.9 % 100 mL IVPB (0 g intravenous Stopped 12/23/23 0941)  acetaminophen (TYLENOL) suppository 650 mg (650 mg rectal Given 12/22/23 1654)  HYDROmorphone (DILAUDID) 1 mg/mL injection 0.4 mg (0.4 mg intravenous Given 12/22/23 1654)  naloxone (NARCAN) injection 0.4 mg (has no administration in time range)  diazePAM (VALIUM) injection 5 mg (5 mg intravenous Given 12/22/23 1210)  D5 %-0.45 % sodium chloride  infusion (0 mL/hr intravenous Stopped 12/23/23 0300)  QUEtiapine (SEROquel) tablet 50 mg (0 mg oral Hold 12/22/23 2100)  lactulose (CHRONULAC) 10 gram/15 mL solution 20 g ( oral Dose Auto Held 01/07/24 2100)  revefenacin (YUPELRI) 175 mcg/3 mL nebulizer solution 175 mcg (175 mcg nebulization Given 12/23/23 1055)    And  budesonide  (PULMICORT ) 0.5 mg/2 mL nebulizer solution 0.5 mg (0.5 mg nebulization Given 12/23/23 1055)    And  formoterol  (PERFOROMIST ) 20 mcg/2 mL nebulizer solution 20 mcg (20 mcg nebulization Given 12/23/23 1055)  omeprazole (PriLOSEC) DR capsule 20 mg (0 mg oral Hold 12/23/23 0600)  potassium chloride 10 mEq in sterile water 100 mL IVPB (0 mEq intravenous Stopped 12/23/23 1122)  hydrocortisone sodium  succinate (PF) (Solu-CORTEF) injection 100 mg (100 mg intravenous Given 12/22/23 0435)    Followed by  hydrocortisone sodium succinate (PF) (Solu-CORTEF) injection 50 mg (has no administration in time range)  amLODIPine (NORVASC) tablet 5 mg (has no administration in time range)  iohexoL (OMNIPAQUE) 350 mg iodine/mL injection (SDV) 100 mL (100 mL intravenous Given 12/21/23 1103)  piperacillin-tazobactam (ZOSYN) 4.5 g in sodium chloride  0.9 % 100 mL IVPB (0 g intravenous Stopped 12/21/23 1230)  vancomycin 2 g in sodium chloride  0.9% 500 mL IVPB (0 mg intravenous Stopped 12/21/23 1430)  acetaminophen (TYLENOL) suppository 650 mg (650 mg rectal Given 12/21/23 1638)  heparin bolus from infusion 100 units/mL (7,700 Units intravenous Bolus from Infusion 12/21/23 1906)  acetaminophen (OFIRMEV) IVPB 1,000 mg (0 mg intravenous Stopped 12/21/23 2122)  lactated ringer's (bolus) bolus 500 mL (0 mL intravenous Stopped 12/22/23 0106)  hydrALAZINE (APRESOLINE) injection 10 mg (10 mg intravenous Given 12/23/23 0239)   Called back to scanner due  to hypotension.  Reevaluated at bedside myself and trauma team.  Patient remains GCS 14 following commands without issue. Repeat pressure reassuring.   ED Course: Given hypotension and elevated lactic with increased sputum production also considered fall secondary to infection/sepsis.  Broad-spectrum antibiotics ordered.  The patient's entire clinical course is incomplete at my shift end, care is taken over by assuming physician, and requires follow up on final trauma surgery recs and imaging results. Please refer to their documentation regarding continued ED course along with final impression and disposition. The patient's vitals were stable at the time of transferred care.  This was a shared visit with my attending, who also evaluated pt, personally interpreted lab results, and is agreeable to plan. Patient's presentation is most consistent with acute presentation with  potential threat to life or bodily function.     ED Disposition:  Admit Final diagnoses:  Fall, initial encounter    ED Prescriptions   None           [1] Past Medical History: Diagnosis Date   Acute deep vein thrombosis (DVT) of femoral vein of left lower extremity    (CMD)    Acute respiratory failure with hypoxia    (CMD)    Alcohol intoxication without use disorder, uncomplicated    Anxiety    At risk for falls    AVN (avascular necrosis of bone)    (CMD)    AVN (avascular necrosis of bone)    (CMD)    Benign essential HTN    Bilateral carotid artery stenosis    Bilateral carotid artery stenosis    Bilateral groin pain    Bimalleolar fracture of right ankle    Breast lump    Chronic GERD    Chronic pain    Closed trimalleolar fracture of right ankle    COPD (chronic obstructive pulmonary disease)    (CMD)    DDD (degenerative disc disease), lumbar    Depression    Dyspnea    Erythrocytosis    Fall    GERD (gastroesophageal reflux disease)    Hyperlipidemia    Hypertension    Hypokalemia    Idiopathic urticaria    Left hip pain    Left leg weakness    Left sided numbness    Left-sided weakness    Lower abdominal pain    Lumbar radicular pain    Macrocytosis without anemia    Mixed hyperlipidemia    Muscle weakness (generalized)    Osteoarthritis    Pain in joint, pelvic region and thigh    Primary osteoarthritis of both hips    Sacroiliac joint pain    SDH (subdural hematoma) (CMD)    SI (sacroiliac) pain    Spondylolysis    Stenosis of carotid artery    bilateral   Thrush of mouth and esophagus    (CMD)    Tremor   [2] Past Surgical History: Procedure Laterality Date   ANKLE FRACTURE SURGERY Right    Procedure: ANKLE FRACTURE SURGERY   ANKLE HARDWARE REMOVAL Right 02/01/2017   Procedure: Hardware Removal Ankle;  Surgeon: Vinie Carlin Fonder, MD;  Location: [facility];  Service:  Orthopedics;  Laterality: Right;   APPENDECTOMY     Procedure: APPENDECTOMY   CHOLECYSTECTOMY     Procedure: CHOLECYSTECTOMY   COLONOSCOPY  05/20/2007   Procedure: COLONOSCOPY   FOOT SURGERY     Procedure: FOOT SURGERY   HYSTERECTOMY      Procedure: HYSTERECTOMY   JOINT REPLACEMENT Left  Procedure: JOINT REPLACEMENT; THR   OTHER SURGICAL HISTORY Right 03/02/2015   Procedure: OTHER SURGICAL HISTORY (open treatment bimalleolar ankle fracture)   SACROILIAC JOINT INJECTION Bilateral 03/21/2020   Procedure: SACROILIAC JOINT INJECTION Bilateral;  Surgeon: Toribio Fairy Badder, MD;  Location: [facility];  Service: Pain Services;  Laterality: Bilateral;   SACROILIAC JOINT INJECTION Bilateral 01/14/2023   INJECTION/ASPIRATION JOINT SACROILLIAC Bilateral SI joint injection performed by Toribio Badder, MD at [facility]   TOTAL HIP ARTHROPLASTY Left 10/04/2016   Procedure: POSTERIOR LEFT TOTAL HIP REPLACEMENT;  Surgeon: Vinie Carlin Fonder, MD;  Location: [facility];  Service: Orthopedics;  Laterality: Left;   TOTAL HIP ARTHROPLASTY Right 02/01/2017   Procedure: TOTAL HIP REPLACEMENT - POSTERIOR APPROACH;  Surgeon: Vinie Carlin Fonder, MD;  Location: [facility];  Service: Orthopedics;  Laterality: Right;  general   TUBAL LIGATION     Procedure: TUBAL LIGATION  [3] Family History Problem Relation Name Age of Onset   Cancer Mother         lung   COPD Maternal Aunt     Lung cancer Maternal Aunt     COPD Maternal Uncle     Lung cancer Maternal Uncle     Breast cancer Neg Hx     Ovarian cancer Neg Hx    [4] Social History Tobacco Use   Smoking status: Former    Current packs/day: 1.00    Types: Cigarettes   Smokeless tobacco: Never  Vaping Use   Vaping status: Never Used  Substance Use Topics   Alcohol use: Not Currently   Drug use: No

## 2023-12-26 NOTE — Progress Notes (Signed)
 Case Management Discharge Note        CSN: [id number] DOB: September 03, 1955 Service: General Medicine Location: N828/A  Patient Class: Inpatient  DC Disposition: : Home or Self Care  Discharge DC Disposition: : Home or Self Care Discharge Transport: Ambulance Discharge Transport Agency Chosen: [facility] [phone])  Discharge Referrals Case closed, patient/family agree with disposition plan: Yes  PA-C reports that after speaking with palliative care who saw pt/daughter while pt was in hospital that plans were for pt to dc home today via ambulance. Since pt's daughter has revoked hospice with [facility] several times, daughter was instructed  by PA-C to contact [facility] to reevaluate for home hospice resumption if that is what pt/daughter preferred. This worker contacted pt's daughter to informed her of ambulance transport plans. Daughter reports that she has left voice mail for hospice director [place]) reporting that she asked her to reevaluate pt in order to resume home hospice services. Daughter reports that she doesn't want this worker to make a home health referral or to order any dme for pt. She reports that she wants to f/u with [facility] regarding resuming services again. She reports that she already has all the dme that she needs. This worker spoke with 2 reps from [facility] [phone] who contacted this worker this pm. Sw informed them of above dc plans and that pt's daughter was wanting to speak with them about evaluating for resumption of home hospice services.  Pt was transferred home via [facility] at 3 pm today.  No request for further intervention. Case closed.     Case Management Coordination Status: Coordination Complete     Mliss Kitty, MSW

## 2023-12-26 NOTE — Discharge Summary (Signed)
 ------------------------------------------------------------------------------- Attestation signed by Amber Furnish, MD at 12/26/2023  5:29 PM I reviewed pertinent labs and discussed plan of care with Amber Merritt, PA-C. Charges per APP documentation. Electronically Signed by: Amber Furnish, MD,  Attending Physician, Internal Medicine / Hospital Medicine 12/26/2023 5:29 PM  -------------------------------------------------------------------------------    Hospital Medicine Discharge Summary   Demographics: Amber Scott  68 y.o. [birth year] MRN: [id number]    Extended Emergency Contact Information Primary Emergency Contact: Amber Scott Mobile Phone: [phone] Relation: Daughter  Full Code  Admit Date: 12/21/2023                            Attending Physician: Amber Furnish, MD Discharge Date: 12/26/2023  Primary Care Provider: Charmaine Marcine Scott, [place]   [phone]  Consults during this admission: Consult Orders             IP CONSULT TO ORTHOPEDIC SURGERY       Specialty:  Orthopedic Surgery  Provider:  (Not yet assigned)      IP CONSULT TO VASCULAR SURGERY       Specialty:  Vascular Surgery  Provider:  (Not yet assigned)      IP CONSULT TO TRAUMA SURGERY       Specialty:  Trauma Surgery  Provider:  (Not yet assigned)             Active & Resolved Diagnosis: Principal Problem (Resolved):   Sepsis    (CMD) Active Problems:   Anxiety and depression   Benign essential HTN   Polypharmacy   Chronic respiratory failure with hypoxia, on home oxygen therapy    (CMD)   Bronchiectasis (CMD)   Sleep apnea   Anemia   Hypokalemia   Lewy body dementia (CMD)   Hospice care patient   Closed fracture of proximal end of left tibia   Closed fracture of one rib   Thrombus of aorta    (CMD)   Aortic thrombus    (CMD)   End stage COPD    (CMD) Resolved Problems:   Delirium   AKI (acute kidney injury)   Aspiration pneumonitis    (CMD)   High anion gap    Elevated lactic acid level   Septic shock    (CMD)  Disposition: Patient discharged to Home in fair condition.    Scheduled Future Appointments       Provider Department Dept Phone Center   01/04/2024 2:00 PM [facility] MG1 [facility]   - [place] OUTPATIENT Amber Scott [phone] [facility] Out[facility]   01/18/2024 9:20 AM Amber Scott [facility] - Orthopedics Trauma [phone] [facility] [place]   02/14/2024 1:00 PM Amber Scott [facility] - Cardiology [place] [phone] [place] [facility] Course: Amber Scott is a 68 year old female with a history of Lewy body dementia, end-stage COPD on home oxygen and hospice, bronchiectasis, heart failure with preserved ejection fraction, hypertension, and chronic respiratory failure, who was admitted after being found down at home following a ground-level fall. On presentation, she was hypotensive and in acute distress, with laboratory evidence of sepsis including elevated lactic acid, acute kidney injury, and acute hypoxemic respiratory failure. Trauma imaging revealed an acute nondisplaced left anterior sixth rib fracture, a closed fracture of the proximal left tibia, and an incidental pedunculated thrombus of the proximal descending thoracic aorta.  Sepsis was the principal problem, with CT chest, abdomen, and pelvis showing bibasilar  predominant airspace opacities concerning for aspiration, and no evidence of urinary or other sources of infection. She was managed with broad-spectrum antibiotics (initially vancomycin and piperacillin-tazobactam, later de-escalated to piperacillin-tazobactam alone), stress-dose steroids, and supportive care including IV fluids and oxygen therapy. Blood cultures and respiratory viral panels remained negative, and lactic acidosis and AKI resolved with resuscitation.   For the aortic thrombus, vascular surgery recommended  anticoagulation, and she was started on a heparin infusion, later transitioned to apixaban per family preference and goals of care. No surgical intervention was pursued. Orthopedic evaluation of the left tibial fracture and rib fracture resulted in nonoperative management with a knee brace and nonweightbearing status for the left lower extremity, with outpatient follow-up arranged.  Delirium and agitation were managed with environmental and pharmacologic interventions, including holding several home medications for polypharmacy and resuming home morphine, Seroquel, and Ativan as needed for comfort and safety. She experienced intermittent confusion and agitation, requiring safety precautions and palliative care consultation for goals of care clarification.  Speech-language pathology performed serial swallow evaluations, initially recommending NPO status due to concern for dysphagia and aspiration risk, then clearing her for a regular diet after a modified barium swallow demonstrated only minimal oropharyngeal dysphagia without aspiration.  Other issues addressed during admission included management of hypokalemia with supplementation, fluctuating blood pressures with antihypertensive adjustments, and monitoring for sleep apnea and bradycardia, with CPAP initiated at night. Physical therapy evaluated her for impaired mobility and recommended skilled therapy and assistance at home, noting significant deconditioning and cognitive impairment.  By the end of the hospitalization, her mentation and oxygen requirements had improved, pain was controlled, and she was transitioned to oral anticoagulation in preparation for discharge home with hospice, per family wishes.  Assessment & Plan Sepsis    (CMD) (Resolved: 12/26/2023) The patient has an identified infection and sepsis associated with organ dysfunction evidenced by: acute respiratory failure with the following physical findings of respiratory distress:  tachycardia and is requiring 5 L/min of oxygen to maintain an oxygen saturation of 90% or greater. The patient will be monitored closely and managed with antibiotics, serial labs, and resuscitation.  Septic on presentation with endorgan dysfunction: Lactic acidosis, hypotension, AKI, acute hypoxemic respiratory failure  blood culture NGTD, RVP negative  lactic acidosis resolved, AKI resolved Completed Zosyn 5 day course Delirium (Resolved: 12/26/2023) Polypharmacy Anxiety and depression Hx Questionable diagnosis of Lewy body dementia  with no clinical evidence or features Pt on Seroquel, ativan at baseline along with multiple other meds. Concern for polypharmacy.  Palliative team consulted, patient was on home hospice, patient and family wanted everything to be done, daughter and patient maintain they want to discharge home on hospice but continue all medications, full code. Continue home medications. MBS: cleared for regular diet Aspiration pneumonitis    (CMD) (Resolved: 12/26/2023) The patient has evidence of the aspiration with an associated pneumonitis based on a lower lobe infiltrate on chest x-ray and decreased oxygen saturation.   RVP negative, RSV negative. CT chest: Bibasilar predominant airspace opacities concerning for infection, cannot rule out superimposed infection Blood culture no growth SLP follow-up done with modified barium swallow and cleared for regular diet Completed 5 day Zosyn course. On chronic Azithromycin  for COPD Thrombus of aorta    (CMD) Imaging showed  intraluminal filling defect along the proximal descending thoracic aorta concerning for pedunculated thrombus Family did not want any interventions with vascular surgery, opting for medical mgt with anticoagulation Vascular recommended anticoagulation if consistent with goal of care Provider discussed with  daughter on 12/14,  she wanted to continue anticoagulation even if hospice is not going to Pay.  Daughter  reported that patient is still going home on hospice care.  Eliquis loading dose for 7 days then continue on maintenance dose.  Hospice care patient Bronchiectasis (CMD) Chronic respiratory failure with hypoxia, on home oxygen therapy    (CMD) End stage COPD    (CMD)  patient has advanced bronchiectasis, on home oxygen 3-4 L  patient has advanced COPD, was on home with hospice care, brought to hospital because of fall  continue formoterol , Pulmicort , Yupelri nebulizer, DuoNeb nebulizer as needed  initially treated with stress dose steroid due to hypotension, now switched to home Decadron  patient home Claritin, Mucinex AKI (acute kidney injury) (Resolved: 12/26/2023) Patient had AKI on presentation most likely prerenal which now resolved with fluid resuscitation Closed fracture of proximal end of left tibia Closed fracture of one rib Scans in ED showed-Acute nondisplaced left anterior sixth rib fracture and left tibial fracture. Overall no convincing evidence of acute fracture or traumatic malalignment of the cervical, thoracic or lumbar spine. Sclerotic focus within the base of the C6 spinous process, possibly representing a bone island however nonspecific. Ortho and trauma consulted in ED, no surgical plans.No T/L spine precautions from Trauma surgery perspective,  cleared for C-spine precaution  Nonweightbearing left lower extremity Continue unlocked hinged knee brace till follow-up with orthopedic Follow-up with orthopedic in 3 to 4 weeks Hypokalemia Treated with PO and IV while inpatient Benign essential HTN  Initially blood pressure was low in setting of sepsis, has now been persistently elevated  some component of high blood pressure could be secondary to agitation, anxiety, pain Continue amlodipine 10 mg daily Sleep apnea  patient has obesity with BMI 35, diagnosis of sleep apnea but not on CPAP  having apneic episode during sleep with bradycardia Continue nightly CPAP Anemia   health stable chronic normocytic anemia High anion gap (Resolved: 12/26/2023) Elevated lactic acid level (Resolved: 12/26/2023)  Initially presented with anion gap metabolic acidosis from elevated lactate which now resolved.    Patient Lines/Drains/Airways Status     Active Peripherally inserted central catheter / Central venous catheter / Urethral Catheter     Name Placement date Placement time Site Days   Urethral Catheter Temperature probe;Non-Latex 16 Fr. 12/21/23  1225  Temperature probe;Non-Latex  5           Wound / Incision Assessment: Refer to Chart Review and Media Tab for images if available.  Wound 01/14/23 Procedural puncture Back Lower;Medial (Active)  Date First Assessed: 01/14/23   Pre-Existing Wound: No  Primary Wound Type: Procedural puncture  Location: Back  Wound Location Orientation: Lower;Medial  Wound Description (Comments): Band aid x 1    Vital Sign Range:  Temp:  [97.5 F (36.4 C)-99.8 F (37.7 C)] 97.5 F (36.4 C) Heart Rate:  [51-78] 61 Resp:  [13-24] 18 BP: (141-189)/(66-94) 155/73  Anticoagulant Medications     Direct Factor Xa Inhibitors Start End   * apixaban (ELIQUIS) 5 mg tab 12/26/2023 --   Sig: Take 2 tablets (10 mg total) by mouth twice daily for 11 doses (starting evening of 12/26/23). Then, starting the morning of 01/01/24, take 1 tablet (5 mg total) by mouth daily.   Renewals     Renewal requests to authorizing provider (Amber Merritt, PA-C) <b>prohibited</b>        * apixaban (ELIQUIS) 5 mg tab 01/24/2024 --   Sig - Route: Take 1 tablet (5 mg  total) by mouth 2 (two) times a day. Start taking AFTER initial prescription with loading dose. - oral   Renewals     Renewal requests to authorizing provider Gill Gold, PA-C) <b>prohibited</b>           General: Alert, no apparent distress. On 2L O2     Cardiovascular: Regular rate and rhythm, s1, s2, no murmurs or gallops.   Chest/Lungs: Clear to auscultation  bilaterally, no wheezes or rhonchi. Normal respiratory rate.   Abdomen: Positive bowel sounds. Abdomen soft, nondistended, nontender.      Extremities: No pitting edema. LLE in locked brace  Psych: Normal affect, pleasant mood       Discharge Medications     New Medications      Sig Disp Refill Start End  amLODIPine 10 mg tablet Commonly known as: NORVASC  Take 1 tablet (10 mg total) by mouth daily.  90 tablet  0  December 27, 2023    * apixaban 5 mg Tab Commonly known as: ELIQUIS  Take 2 tablets (10 mg total) by mouth twice daily for 11 doses (starting evening of 12/26/23). Then, starting the morning of 01/01/24, take 1 tablet (5 mg total) by mouth daily.  70 tablet  0     * apixaban 5 mg Tab Commonly known as: ELIQUIS  Take 1 tablet (5 mg total) by mouth 2 (two) times a day. Start taking AFTER initial prescription with loading dose.  60 tablet  3  January 24, 2024    gabapentin 600 mg tablet Commonly known as: NEURONTIN  Take one-half tablet (300 mg total) by mouth 3 (three) times a day. (Take 0.5 tablets (300 mg total) by mouth 3 (three) times a day.)  135 tablet  0        * * There are duplicate medications prescribed to the patient          Modified Medications      Sig Disp Refill Start End  aspirin 81 mg EC tablet What changed: See the new instructions.  TAKE 1 TABLET BY MOUTH ONCE DAILY *NEW PRESCRIPTION REQUEST*  90 tablet  10         Medications To Continue      Sig Disp Refill Start End  acetaminophen 500 mg tablet Commonly known as: TYLENOL  Take 1,000 mg by mouth every 6 (six) hours as needed for mild pain (1-3) or fever 100.4 F or GREATER.   0     albuterol  HFA 90 mcg/actuation inhaler Commonly known as: PROVENTIL  HFA;VENTOLIN  HFA;PROAIR  HFA  Inhale 2 puffs every 6 (six) hours as needed for wheezing.  18 g  3     Allergy Relief (cetirizine ) 10 mg tablet Generic drug: cetirizine   TAKE 1 TABLET BY MOUTH ONCE DAILY *NEW  PRESCRIPTION REQUEST*  90 tablet  10     azithromycin  250 mg tablet Commonly known as: ZITHROMAX   Take 250 mg by mouth See Admin Instructions. PLEASE SEE ATTACHED FOR DETAILED DIRECTIONS   0     cyclobenzaprine 5 mg tablet Commonly known as: FLEXERIL  Take 5 mg by mouth 3 (three) times a day as needed for muscle spasms.   0     dexAMETHasone 4 mg tablet Commonly known as: DECADRON  Take 4 mg by mouth every morning.   0     Enulose 10 gram/15 mL solution Generic drug: lactulose  Take 30 mL by mouth 2 (two) times a day.   0     famotidine  20  mg tablet Commonly known as: PEPCID   Take 1 tablet by mouth in the morning and 1 tablet in the evening.   0     furosemide 40 mg tablet Commonly known as: LASIX  Take one tablet as needed if leg swelling and/or daily weight is more than 2 lbs or weekly weight is more than 5 lb. Take daily until wait is back to and leg swelling is better.  90 tablet  0     guaiFENesin 600 mg 12 hr tablet Commonly known as: MUCINEX  Take 1,200 mg by mouth 2 (two) times a day.   0     HYDROmorphone 4 mg tablet Commonly known as: DILAUDID  Take 4 mg by mouth every 4 (four) hours as needed.   0     ibuprofen 600 mg tablet Commonly known as: MOTRIN  Take 600 mg by mouth every 6 (six) hours as needed.   0     ipratropium-albuteroL  0.5-2.5 mg/3 mL nebulizer solution Commonly known as: DUO-NEB  Take 3 mL by nebulization every 6 (six) hours as needed for wheezing.  300 mL  0     LORazepam 0.5 mg tablet Commonly known as: ATIVAN  Take 0.5 mg by mouth every 6 (six) hours as needed. Gives 2 at bedtime normally   0     morphine 15 mg 12 hr tablet Commonly known as: MS Contin  Take 15 mg by mouth every 8 (eight) hours as needed. as directed (0900, 1700, 0100)   0     omeprazole 20 mg DR capsule Commonly known as: PriLOSEC  Take 1 capsule by mouth in the morning and 1 capsule in the evening.   0     ondansetron 4 mg tablet Commonly known as:  ZOFRAN  4 mg every 6 (six) hours as needed.   0     oxygen gas Commonly known as: O2  Inhale 3 L continuous.   0     QUEtiapine 50 mg tablet Commonly known as: SEROquel  Take 50 mg by mouth at bedtime.   0     Trelegy Ellipta  100-62.5-25 mcg inhaler Generic drug: fluticasone -umeclidinium-vilanterol  Inhale 1 puff by mouth in the morning. (Inhale 1 puff in the morning.)  180 each  0         Discharge Orders     Full Code     Lifting Limits:     Details:    Lifting Limits: No lifting limits   Regular diet     Details:    Diet type: Regular       Physical Therapy Recommendations: Home with caregiver   Lab Results  Component Value Date/Time   HGB 10.0 (L) 12/26/2023 05:33 AM   HCT 31.0 (L) 12/26/2023 05:33 AM   WBC 8.30 12/26/2023 05:33 AM   PLT 257 12/26/2023 05:33 AM   Lab Results  Component Value Date/Time   NA 138 12/26/2023 05:33 AM   K 3.3 (L) 12/26/2023 05:33 AM   CREATININE 0.61 12/26/2023 05:33 AM   BUN 12 12/26/2023 05:33 AM   GLUCOSE 96 12/26/2023 05:33 AM    Pertinent Imaging: XR Knee 1-2 Views Right  Final Result by Roena Hy, MD (12/10 1428)  X-RAY KNEE RIGHT (1-2 VIEWS), 12/21/2023 12:57 PM    INDICATION: Trauma   COMPARISON: Right knee x-ray 08/11/2021    IMPRESSION:  1.  No acute fracture.   2.  No malalignment.  3.  No joint effusion.  4.  Joint spaces  are maintained.      XR Tibia Fibula 2 Views Left  Final Result by Roena Hy, MD (12/10 1432)  XR TIBIA FIBULA 2 VIEWS LEFT, 12/21/2023 12:54 PM    INDICATION: Trauma   COMPARISON: None.    IMPRESSION:  1.  Cortical step-off along the medial aspect of the proximal tibia with   adjacent osseous sclerosis concerning for an acute fracture. Correlate   with point tenderness.   2.  No malalignment.  3.  Joint spaces are maintained.      XR Tibia Fibula 2 Views Right  Final Result by Roena Hy, MD (12/10 1432)  X-RAY LEG (TIBIA/FIBULA) RIGHT (2 VIEWS), 12/21/2023 12:54  PM    INDICATION: trauma   COMPARISON: Right ankle radiographs 03/18/2016.    IMPRESSION:  1.  No acute fracture.   2.  No malalignment.  3.  Joint spaces are maintained.  4.  Remote distal fibular fracture with lateral plating. Minimal lucencies   about the syndesmotic screw suggestive of loosening, similar to prior.      XR Knee 1-2 Views Left  Final Result by Roena Hy, MD (12/10 1428)  X-RAY KNEE LEFT (1-2 VIEWS), 12/21/2023 12:53 PM    INDICATION: Trauma   COMPARISON: None.    IMPRESSION:  1.  Cortical step-off along the medial aspect of the proximal tibia with   adjacent osseous sclerosis concerning for an acute fracture. Correlate   with point tenderness.  2.  No malalignment.  3.  No joint effusion.  4.  Mild medial compartment degenerative changes.  5.  Superior patellar enthesopathy.      XR Hip Uni W Or W/O Pelvis 2-3 Vw Rt  Final Result by Roena Hy, MD (12/10 1427)  X-RAY HIP RIGHT WITH OR WITHOUT PELVIS (2-3 VIEWS), 12/21/2023 12:52 PM    INDICATION: trauma   COMPARISON: Hip radiographs 08/11/2021    IMPRESSION:  1.  No acute fracture.  2.  No malalignment.  3.  Right hip arthroplasty without evidence of hardware complication.      XR Hip Uni W Or W/O Pelvis 2-3 Vw Left  Final Result by Roena Hy, MD (12/10 1427)  X-RAY HIP LEFT WITH OR WITHOUT PELVIS (2-3 VIEWS), 12/21/2023 12:51 PM    INDICATION: trauma   COMPARISON: Hip x-ray 08/11/2021    IMPRESSION:  1.  No acute fracture.  2.  No malalignment.  3.  Left hip arthroplasty. No hardware complications.      XR Foot 2 Views Right  Final Result by Roena Hy, MD (12/10 1427)  X-RAY FOOT RIGHT (2 VIEWS), 12/21/2023 12:50 PM    INDICATION: Trauma   COMPARISON: Right ankle radiographs 03/18/2016.    IMPRESSION:  1.  No acute fracture.   2.  No malalignment.  3.  Joint spaces are maintained.  4.  Remote distal fibular fracture with lateral plating. Minimal lucencies   about the syndesmotic  screw suggestive of loosening, similar to prior.         XR Femur Minimum 2 Vw Right  Final Result by Roena Hy, MD (12/10 1426)  X-RAY FEMUR/THIGH RIGHT (2+ VIEWS), 12/21/2023 12:50 PM    INDICATION: Trauma   COMPARISON: Femur x-ray 08/11/2021.    IMPRESSION:  1.  No acute fracture.  2.  No malalignment.  3.  Right hip arthroplasty without evidence of hardware complication.      XR Femur Minimum 2 Vw Left  Final Result by Roena Hy, MD (12/10 1426)  X-RAY FEMUR/THIGH  LEFT (2+ VIEWS), 12/21/2023 12:49 PM    INDICATION: Trauma   COMPARISON: Left hip x-ray 08/11/2021    IMPRESSION:  1.  No acute fracture.  2.  No malalignment.  3.  Left hip arthroplasty. No hardware complications.      XR Ankle 2 Views Right  Final Result by Roena Hy, MD (12/10 1425)  X-RAY ANKLE RIGHT 2 VIEWS, 12/21/2023 12:48 PM    INDICATION: Trauma   COMPARISON: Right ankle radiographs 03/18/2016.    IMPRESSION:  1.  No acute fracture.   2.  No malalignment.  3.  Joint spaces are maintained.  4.  Remote distal fibular fracture with lateral plating. Minimal lucencies   about the syndesmotic screw suggestive of loosening, similar to prior.  5.  Calcaneal enthesopathy.        XR Ankle Minimum 3 Views Left  Final Result by Roena Hy, MD (12/10 1426)  XR ANKLE MINIMUM 3 VIEWS LEFT, 12/21/2023 12:47 PM    INDICATION: Trauma   COMPARISON: None.    IMPRESSION:  1.  No acute fracture.   2.  No malalignment.  3.  Joint spaces are maintained.  4.  Calcaneal enthesopathy.      XR Foot 2 Views Left  Final Result by Roena Hy, MD (12/10 1426)  X-RAY FOOT LEFT (2 VIEWS), 12/21/2023 12:47 PM    INDICATION: Trauma   COMPARISON: None.    IMPRESSION:  1.  No acute fracture.   2.  No malalignment.  3.  Joint spaces are maintained.  4.  Calcaneal enthesopathy.      CT Spine Thoracic Lumbar Reformats W Contrast  Final Result by Waddell Norleen Ruts, MD (12/10 1235)  CT THORACIC AND LUMBAR  SPINE REFORMATS WITH CONTRAST, 12/21/2023 11:05 AM    INDICATION: Mid-back pain, trauma     COMPARISON: CT thoracic and lumbar spine reformats 02/26/2018. CT chest   08/01/2023.    TECHNIQUE: Thin-section axial CT images of the entire thoracic and lumbar   spine were acquired after intravenous administration of iodine-based   contrast as part of a chest, abdomen, and pelvis CT examination. A request   was made for the original data set to be reformatted in the coronal and   sagittal planes using bone algorithm and reviewed as a screen for bony   spinal pathology.    All CT scans at [facility] and [facility]   Imaging are performed using radiation dose optimization techniques as   appropriate to a performed exam, including but not limited to one or more   of the following: automatic exposure control, adjustment of the mA and/or   kV according to patient size, use of iterative reconstruction technique.   In addition, our institution participates in a radiation dose monitoring   program to optimize patient radiation exposure.    LEVELS IMAGED: Lower cervical region to upper sacrum. The sacrum is more   completely evaluated on the CT of the chest, abdomen, and pelvis.    FINDINGS:    Photon starvation mildly limits assessment, most conspicuous at the   cervicothoracic junction and upper thoracic levels.    Alignment: No acute traumatic malalignment. Degenerative retrolisthesis of   L1 on L2, L3 on L4 and grade 1 anterolisthesis of L4 and L5.    Vertebrae: No acute fractures. Osteopenia. Degenerative sclerotic and   cystic endplate changes at several lumbar levels, with mild chronic   appearing height loss involving the L3 superior endplate. Vertebral  body   heights are otherwise maintained.    Degenerative changes: Multilevel thoracolumbar spondylosis with at least   moderate canal stenosis at L2-L3, L3-L4, and L4-L5. Varying degrees of   multilevel  degenerative foraminal narrowing at several lumbar levels.    IMPRESSION:  1.  Within technical constraints of photon starvation, no convincing   evidence of acute fracture or traumatic malalignment of the thoracic or   lumbar spine. If there is continued clinical concern for acute thoracic or   lumbar spine trauma, MRI could provide further evaluation, as osteopenia   further limits sensitivity for detection of nondisplaced fractures.  2.  Multilevel thoracolumbar spondylosis with at least moderate canal   stenosis at L2-L3, L3-L4, and L4-L5.  3.  Please reference the CT of the chest, abdomen, and pelvis for   characterization of all structures outside the thoracic and lumbar spine.    CT Chest Abdomen Pelvis W Contrast  Final Result by Carlin Luci Lawyer, MD (12/10 1217)  CT CHEST ABDOMEN PELVIS W CONTRAST, 12/21/2023 10:57 AM    INDICATION:  Trauma Abdomen/Pelvis   ADDITIONAL HISTORY: None.  COMPARISON: None.    TECHNIQUE: CT images of the chest, abdomen, and pelvis were obtained   following intravenous administration of iodinated contrast. Conventional   axial reconstructions and multiplanar reformatted images were submitted   for review.      FINDINGS:    . Thoracic inlet: Within normal limits.  . Chest wall/Axilla: Within normal limits.  . Central airways: Patent. Tracheal diverticulum.  . Mediastinum/Hila: Small hiatal hernia. Prominent nodes, including 13 mm   right paratracheal (series 5 image 49).  SABRA Heart: Heart size within normal limits. No pericardial effusion. Mitral   annular calcifications.  . Vessels: 7 x 9 x 16 mm intraluminal filling defect along the proximal   descending thoracic aorta (series 5, image 47, coronal series 8 image 81).   Aorta normal in caliber. No central pulmonary embolism. Dilated main   pulmonary artery measuring 4.2 cm which can be seen in the setting of   pulmonary hypertension.   . Lungs/Pleura: Bibasilar predominant airspace  opacities. Opacities along   the posterior aspect of the right upper lobe. Background of centrilobular   and paraseptal emphysema. Diffuse bronchial wall thickening.    . Liver: Similar hypodensities measuring 2.0 cm, favored simple cysts.  . Gallbladder/Biliary: Cholecystectomy.  SABRA Spleen: Unremarkable.  . Pancreas: Unremarkable.  . Adrenals: Unremarkable.  . Kidneys: Unremarkable.    . Peritoneum/Mesenteries/Extraperitoneum: No free air. No free fluid or   loculated drainable collection. No pathologically enlarged lymph nodes.  . Gastrointestinal tract: No evidence of obstruction. Colonic   diverticulosis.    . Ureters: Unremarkable.  . Bladder: Exam limited secondary to streak artifact from adjacent   hardware.  . Reproductive System: Exam limited secondary to streak artifact from   adjacent hardware.    . Vascular: Aortobiiliac calcified atherosclerosis.  . Musculoskeletal: Acute nondisplaced left anterior sixth rib fracture.   Polyarticular degenerative changes. No aggressive focal bony lesions.   Abdominal wall soft tissues unremarkable. Bilateral hip arthroplasties.    IMPRESSION:  1.  Intraluminal filling defect along the proximal descending thoracic   aorta concerning for pedunculated thrombus.  2.  Acute nondisplaced left anterior sixth rib fracture.  3.  Bibasilar predominant airspace opacities concerning for aspiration.   However, cannot rule out superimposed infection.  4.  No acute traumatic findings within the abdomen or pelvis.    CT Spine Cervical WO Contrast  Final Result by Waddell Norleen Ruts, MD (12/10 1218)  CT CERVICAL SPINE WITHOUT CONTRAST, 12/21/2023 10:56 AM    INDICATION: Neck trauma     COMPARISON: CTA head and neck 02/26/2018.    TECHNIQUE: Thin-section axial CT images of the entire cervical spine were   acquired without contrast. Supplemental 2D reformatted images were   generated and reviewed as needed.    All CT scans at [facility] and [facility]   Imaging are performed using radiation dose optimization techniques as   appropriate to a performed exam, including but not limited to one or more   of the following: automatic exposure control, adjustment of the mA and/or   kV according to patient size, use of iterative reconstruction technique.   In addition, our institution participates in a radiation dose monitoring   program to optimize patient radiation exposure.    LEVELS IMAGED: Foramen magnum to upper thoracic region.    FINDINGS:    Motion artifact and photon starvation moderately limit assessment.    Alignment: No acute traumatic malalignment. Similar trace anterolisthesis   of C3 on C4.    Craniocervical junction: No evidence of acute fracture or dislocation.    Vertebrae: No acute fractures. Vertebral body heights maintained. Similar   chronic defect of the posterior arch of C1. Sclerotic focus within the   base of the C6 spinous process, new from the prior CTA examination however   grossly similar to the CT chest from 03/23/2023, possibly representing a   bone island however nonspecific.    Degenerative changes: Multilevel degenerative changes. Dorsal disc   osteophyte complexes at several levels contributing to varying degrees of   canal narrowing, mild at C2-C3, at least mild and possibly moderate at   C3-C4, C4-C5, C5-C6 and C6-C7. The canal is otherwise not well evaluated   due to artifact. Varying degrees of multilevel degenerative foraminal   narrowing related to uncovertebral and facet hypertrophy.    Architectural distortion within the visualized lung apices. Aerated   material within the proximal trachea which could represent mucoid debris.   Patulous esophagus. Partially retropharyngeal course of the carotid   arteries. Bilateral carotid atherosclerotic calcifications.    IMPRESSION:  1.  Overall no convincing evidence of acute fracture or traumatic   malalignment  of the cervical spine, although please note that evaluation   is moderately limited by motion artifact and photon starvation. Could   consider obtaining a repeat examination if there is continued clinical   concern given technical limitations.   2.  Multilevel cervical spine spondylosis. MRI could provide further   assessment if clinically indicated.  3.  Sclerotic focus within the base of the C6 spinous process, new from   the prior CTA examination however grossly similar to the CT chest from   03/23/2023, possibly representing a bone island however nonspecific.    CT Head WO Contrast W Quant CT Tiss Character When Performed  Final Result by Waddell Norleen Ruts, MD (12/10 1157)  CT HEAD WITHOUT CONTRAST, 12/21/2023 10:55 AM    INDICATION: Head trauma, injury suspected     COMPARISON: CT head 08/22/2023.    TECHNIQUE: Axial CT images of the brain from skull base to vertex,   including portions of the face and sinuses, were obtained without   contrast. Supplemental 2D reformatted images were generated and reviewed   as needed.    All CT scans at [facility] and [facility]  Imaging are performed using radiation dose optimization techniques as   appropriate to a performed exam, including but not limited to one or more   of the following: automatic exposure control, adjustment of the mA and/or   kV according to patient size, use of iterative reconstruction technique.   In addition, our institution participates in a radiation dose monitoring   program to optimize patient radiation exposure.    FINDINGS:    Streak artifact from dental amalgam mildly limits assessment of the   intracranial structures.    Calvarium/skull base: No evidence of acute fracture or destructive lesion.   Mastoids and middle ears demonstrate no substantial mucosal disease.    Paranasal sinuses: No air fluid levels.    Brain: No acute large vascular territory infarct. No mass  effect. No   hydrocephalus. No acute hemorrhage. Partially empty sella. Patchy regions   of hypoattenuation within the bilateral periventricular and subcortical   white matter likely reflect sequelae of chronic small vessel ischemia.   Brain parenchymal volume loss with similar mild ex vacuo enlargement of   the ventricles. Intracranial arterial calcifications.    IMPRESSION:  Within technical constraints, no evidence of acute intracranial   abnormality. Consider MRI for further evaluation if there is ongoing   clinical concern.    XR Pelvis 1-2 Views  Final Result by Roena Hy, MD (12/10 1103)  X-RAY PELVIS (1-2 VIEWS), 12/21/2023 10:36 AM    INDICATION: Trauma   COMPARISON: Radiographs 08/11/2021    IMPRESSION:  1.  No acute fracture.  2.  No dislocation.  3.  Bilateral total hip arthroplasties, partially imaged. No complication   of the imaged hardware.  4.  Mild degenerative changes of the SI joints and pubic symphysis.  5.  Vascular calcifications.    XR Chest 1 View  Final Result by Simpson Ronold Rising, MD (12/10 1157)  XR CHEST 1 VIEW, 12/21/2023 10:35 AM    INDICATION:Trauma   COMPARISON: Chest x-ray 12/21/2023    FINDINGS:     Supportive devices: None.  Cardiovascular/lungs/pleura: Cardiac silhouette and pulmonary vasculature   are within normal limits. Diffuse interstitial thickening. No pleural   effusion or pneumothorax.  Other: Unremarkable.    IMPRESSION:  Diffuse interstitial thickening could reflect pulmonary contusion in the   setting of trauma versus mild pulmonary edema. Attention on forthcoming   chest abdomen pelvis CT.          Predictive Model Details        26.9% (Medium)  Factor Value   Calculated 12/26/2023 08:11 20% Number of hospitalizations in last year 4   Readmission Risk Score v2 Model 9% Number of appointments in last 90 days 0    8% Number of ED visits in last 90 days 1    7% Number of active outpatient medication orders  22    6% Braden score 18     Electronically signed by: Amber Merritt, PA-C 12/26/2023 1:00 PM   Time spent on discharge: 45 minutes  *Some images could not be shown.

## 2023-12-27 NOTE — Progress Notes (Signed)
 1st outreach from [facility] TCM unsuccessful, [facility] [phone]   Flowsheet Row Most Recent Value  Engagement   Call number [phone]  Two calls made/Contact successful No  Medications   Appointments   Self Management   Patient Teaching   Wrap Up   Two calls made/Contact successful No

## 2023-12-28 NOTE — Progress Notes (Signed)
 Attempted to call pt x2 for TCM outreach and APC program. The care coordination team was unsuccessful in reaching your pt for outreach and hospital follow up scheduling. Please contact this pt for hospital follow up appointment. Pt needs to be seen by 01/06/24. PCP can refer this pt back to TCM if needed after pt has been contacted and agreeable.
